# Patient Record
Sex: Female | Born: 1970 | Race: Black or African American | Hispanic: No | Marital: Single | Smoking: Never smoker
Health system: Southern US, Community
[De-identification: ages and names within clinical notes are randomized; demographics above are authoritative.]

## PROBLEM LIST (undated history)

## (undated) DIAGNOSIS — Z8049 Family history of malignant neoplasm of other genital organs: Secondary | ICD-10-CM

## (undated) DIAGNOSIS — T7840XA Allergy, unspecified, initial encounter: Secondary | ICD-10-CM

## (undated) DIAGNOSIS — Z800 Family history of malignant neoplasm of digestive organs: Secondary | ICD-10-CM

## (undated) DIAGNOSIS — J309 Allergic rhinitis, unspecified: Secondary | ICD-10-CM

## (undated) DIAGNOSIS — N946 Dysmenorrhea, unspecified: Secondary | ICD-10-CM

## (undated) DIAGNOSIS — Z8042 Family history of malignant neoplasm of prostate: Secondary | ICD-10-CM

## (undated) DIAGNOSIS — Z8041 Family history of malignant neoplasm of ovary: Secondary | ICD-10-CM

## (undated) HISTORY — DX: Family history of malignant neoplasm of digestive organs: Z80.0

## (undated) HISTORY — DX: Family history of malignant neoplasm of ovary: Z80.41

## (undated) HISTORY — DX: Family history of malignant neoplasm of other genital organs: Z80.49

## (undated) HISTORY — DX: Allergic rhinitis, unspecified: J30.9

## (undated) HISTORY — DX: Allergy, unspecified, initial encounter: T78.40XA

## (undated) HISTORY — DX: Family history of malignant neoplasm of prostate: Z80.42

## (undated) HISTORY — PX: WISDOM TOOTH EXTRACTION: SHX21

## (undated) HISTORY — DX: Dysmenorrhea, unspecified: N94.6

---

## 1999-04-28 ENCOUNTER — Other Ambulatory Visit: Admission: RE | Admit: 1999-04-28 | Discharge: 1999-04-28 | Payer: Self-pay | Admitting: Obstetrics and Gynecology

## 2000-04-29 ENCOUNTER — Other Ambulatory Visit: Admission: RE | Admit: 2000-04-29 | Discharge: 2000-04-29 | Payer: Self-pay | Admitting: Obstetrics and Gynecology

## 2002-01-17 ENCOUNTER — Other Ambulatory Visit: Admission: RE | Admit: 2002-01-17 | Discharge: 2002-01-17 | Payer: Self-pay | Admitting: Internal Medicine

## 2003-01-22 ENCOUNTER — Other Ambulatory Visit: Admission: RE | Admit: 2003-01-22 | Discharge: 2003-01-22 | Payer: Self-pay | Admitting: Obstetrics and Gynecology

## 2003-03-06 ENCOUNTER — Ambulatory Visit (HOSPITAL_COMMUNITY): Admission: RE | Admit: 2003-03-06 | Discharge: 2003-03-06 | Payer: Self-pay | Admitting: Internal Medicine

## 2003-08-27 ENCOUNTER — Other Ambulatory Visit: Admission: RE | Admit: 2003-08-27 | Discharge: 2003-08-27 | Payer: Self-pay | Admitting: Obstetrics and Gynecology

## 2004-01-27 ENCOUNTER — Other Ambulatory Visit: Admission: RE | Admit: 2004-01-27 | Discharge: 2004-01-27 | Payer: Self-pay | Admitting: Obstetrics and Gynecology

## 2005-01-25 ENCOUNTER — Ambulatory Visit (HOSPITAL_COMMUNITY): Admission: RE | Admit: 2005-01-25 | Discharge: 2005-01-25 | Payer: Self-pay | Admitting: Obstetrics and Gynecology

## 2010-09-01 ENCOUNTER — Other Ambulatory Visit (HOSPITAL_COMMUNITY): Payer: Self-pay | Admitting: Nurse Practitioner

## 2010-09-01 DIAGNOSIS — Z1231 Encounter for screening mammogram for malignant neoplasm of breast: Secondary | ICD-10-CM

## 2010-09-25 ENCOUNTER — Ambulatory Visit (HOSPITAL_COMMUNITY)
Admission: RE | Admit: 2010-09-25 | Discharge: 2010-09-25 | Disposition: A | Discharge status: Home / Self Care | Payer: BC Managed Care – PPO | Source: Ambulatory Visit | Attending: Nurse Practitioner | Admitting: Nurse Practitioner

## 2010-09-25 DIAGNOSIS — Z1231 Encounter for screening mammogram for malignant neoplasm of breast: Secondary | ICD-10-CM | POA: Insufficient documentation

## 2011-03-31 ENCOUNTER — Ambulatory Visit (INDEPENDENT_AMBULATORY_CARE_PROVIDER_SITE_OTHER): Payer: BC Managed Care – PPO | Admitting: Family Medicine

## 2011-03-31 VITALS — BP 95/64 | HR 93 | Temp 98.7°F | Resp 16 | Ht 65.0 in | Wt 165.0 lb

## 2011-03-31 DIAGNOSIS — D649 Anemia, unspecified: Secondary | ICD-10-CM

## 2011-03-31 DIAGNOSIS — J45909 Unspecified asthma, uncomplicated: Secondary | ICD-10-CM

## 2011-03-31 LAB — POCT CBC
Granulocyte percent: 80.9 %G — AB (ref 37–80)
HCT, POC: 36.2 % — AB (ref 37.7–47.9)
Hemoglobin: 11.2 g/dL — AB (ref 12.2–16.2)
Lymph, poc: 1.1 (ref 0.6–3.4)
MCH, POC: 26.4 pg — AB (ref 27–31.2)
MCHC: 30.9 g/dL — AB (ref 31.8–35.4)
MCV: 85.2 fL (ref 80–97)
MID (cbc): 0.3 (ref 0–0.9)
MPV: 8.7 fL (ref 0–99.8)
POC Granulocyte: 5.9 (ref 2–6.9)
POC LYMPH PERCENT: 14.7 %L (ref 10–50)
POC MID %: 4.4 %M (ref 0–12)
Platelet Count, POC: 214 10*3/uL (ref 142–424)
RBC: 4.25 M/uL (ref 4.04–5.48)
RDW, POC: 14.8 %
WBC: 7.3 10*3/uL (ref 4.6–10.2)

## 2011-03-31 MED ORDER — ALBUTEROL SULFATE (2.5 MG/3ML) 0.083% IN NEBU: 2.5000 mg | INHALATION_SOLUTION | Freq: Four times a day (QID) | RESPIRATORY_TRACT | Status: DC | PRN

## 2011-03-31 NOTE — Progress Notes (Signed)
  Subjective:    Patient ID: Deborah Hughes, female    DOB: Mar 03, 1970, 41 y.o.   MRN: [id number]  HPI Several week history of worsening nasal and facial congestion.  Face pain/pressure, pnd and occ cough(non productive, worse at night) Chills without fever Myalgias   Nonsmoker Review of Systems     Objective:   Physical Exam  Constitutional: She appears well-developed and well-nourished.  HENT:  Head: Normocephalic and atraumatic.  Neck: Neck supple.  Cardiovascular: Normal rate, regular rhythm and normal heart sounds.   Pulmonary/Chest: She has wheezes (coarse bs, prolonged expiratory phase).  Lymphadenopathy:    She has cervical adenopathy.  Neurological: She is alert.  Skin: Skin is warm.     Patient received 2.5 mg albuterol via HHN.  On repeat exam CTA with Inspiratory phase = Expiratory phase     Results for orders placed in visit on 03/31/11  POCT CBC      Component Value Range   WBC 7.3  4.6 - 10.2 (K/uL)   Lymph, poc 1.1  0.6 - 3.4    POC LYMPH PERCENT 14.7  10 - 50 (%L)   MID (cbc) 0.3  0 - 0.9    POC MID % 4.4  0 - 12 (%M)   POC Granulocyte 5.9  2 - 6.9    Granulocyte percent 80.9 (*) 37 - 80 (%G)   RBC 4.25  4.04 - 5.48 (M/uL)   Hemoglobin 11.2 (*) 12.2 - 16.2 (g/dL)   HCT, POC 44.0 (*) 10.2 - 47.9 (%)   MCV 85.2  80 - 97 (fL)   MCH, POC 26.4 (*) 27 - 31.2 (pg)   MCHC 30.9 (*) 31.8 - 35.4 (g/dL)   RDW, POC 72.5     Platelet Count, POC 214  142 - 424 (K/uL)   MPV 8.7  0 - 99.8 (fL)    Assessment & Plan:   1. Asthmatic bronchitis  POCT CBC, albuterol (PROVENTIL) (2.5 MG/3ML) 0.083% nebulizer solution, Peak flow, azithromycin (ZITHROMAX) 250 MG tablet, predniSONE (DELTASONE) 20 MG tablet, fluticasone (FLONASE) 50 MCG/ACT nasal spray  2. Anemia, mild      Anticipatory guidance. Call with follow up in 48 hours, sooner if worse.

## 2011-04-01 DIAGNOSIS — D649 Anemia, unspecified: Secondary | ICD-10-CM | POA: Insufficient documentation

## 2011-04-01 MED ORDER — PREDNISONE 20 MG PO TABS: ORAL_TABLET | ORAL | Status: AC

## 2011-04-01 MED ORDER — FLUTICASONE PROPIONATE 50 MCG/ACT NA SUSP: 2.0000 | Freq: Every day | NASAL | Status: DC

## 2011-04-01 MED ORDER — AZITHROMYCIN 250 MG PO TABS: ORAL_TABLET | ORAL | Status: AC

## 2011-09-20 ENCOUNTER — Other Ambulatory Visit (HOSPITAL_COMMUNITY): Payer: Self-pay | Admitting: Nurse Practitioner

## 2011-09-20 DIAGNOSIS — Z1231 Encounter for screening mammogram for malignant neoplasm of breast: Secondary | ICD-10-CM

## 2011-10-12 ENCOUNTER — Ambulatory Visit (HOSPITAL_COMMUNITY)
Admission: RE | Admit: 2011-10-12 | Discharge: 2011-10-12 | Disposition: A | Discharge status: Home / Self Care | Payer: BC Managed Care – PPO | Source: Ambulatory Visit | Attending: Nurse Practitioner | Admitting: Nurse Practitioner

## 2011-10-12 DIAGNOSIS — Z1231 Encounter for screening mammogram for malignant neoplasm of breast: Secondary | ICD-10-CM

## 2012-05-12 ENCOUNTER — Other Ambulatory Visit: Payer: Self-pay | Admitting: Obstetrics and Gynecology

## 2012-05-12 ENCOUNTER — Other Ambulatory Visit: Payer: Self-pay | Admitting: Internal Medicine

## 2012-05-12 DIAGNOSIS — N6320 Unspecified lump in the left breast, unspecified quadrant: Secondary | ICD-10-CM

## 2012-05-18 ENCOUNTER — Ambulatory Visit
Admission: RE | Admit: 2012-05-18 | Discharge: 2012-05-18 | Disposition: A | Discharge status: Home / Self Care | Payer: BC Managed Care – PPO | Source: Ambulatory Visit | Attending: Obstetrics and Gynecology | Admitting: Obstetrics and Gynecology

## 2012-05-18 DIAGNOSIS — N6320 Unspecified lump in the left breast, unspecified quadrant: Secondary | ICD-10-CM

## 2012-05-24 ENCOUNTER — Other Ambulatory Visit: Payer: BC Managed Care – PPO

## 2012-09-25 ENCOUNTER — Other Ambulatory Visit: Payer: Self-pay

## 2012-09-25 DIAGNOSIS — Z1231 Encounter for screening mammogram for malignant neoplasm of breast: Secondary | ICD-10-CM

## 2012-10-27 ENCOUNTER — Ambulatory Visit
Admission: RE | Admit: 2012-10-27 | Discharge: 2012-10-27 | Disposition: A | Discharge status: Home / Self Care | Payer: BC Managed Care – PPO | Source: Ambulatory Visit

## 2012-10-27 DIAGNOSIS — Z1231 Encounter for screening mammogram for malignant neoplasm of breast: Secondary | ICD-10-CM

## 2015-10-21 ENCOUNTER — Other Ambulatory Visit: Payer: Self-pay | Admitting: Obstetrics and Gynecology

## 2015-10-21 DIAGNOSIS — Z1231 Encounter for screening mammogram for malignant neoplasm of breast: Secondary | ICD-10-CM

## 2015-12-23 ENCOUNTER — Other Ambulatory Visit: Payer: Self-pay | Admitting: Obstetrics and Gynecology

## 2015-12-23 ENCOUNTER — Ambulatory Visit
Admission: RE | Admit: 2015-12-23 | Discharge: 2015-12-23 | Disposition: A | Discharge status: Home / Self Care | Payer: BC Managed Care – PPO | Source: Ambulatory Visit | Attending: Obstetrics and Gynecology | Admitting: Obstetrics and Gynecology

## 2015-12-23 ENCOUNTER — Other Ambulatory Visit (HOSPITAL_COMMUNITY)
Admission: RE | Admit: 2015-12-23 | Discharge: 2015-12-23 | Disposition: A | Discharge status: Home / Self Care | Payer: BC Managed Care – PPO | Source: Ambulatory Visit | Attending: Obstetrics and Gynecology | Admitting: Obstetrics and Gynecology

## 2015-12-23 DIAGNOSIS — Z01419 Encounter for gynecological examination (general) (routine) without abnormal findings: Secondary | ICD-10-CM | POA: Insufficient documentation

## 2015-12-23 DIAGNOSIS — Z1151 Encounter for screening for human papillomavirus (HPV): Secondary | ICD-10-CM | POA: Diagnosis present

## 2015-12-23 DIAGNOSIS — Z1231 Encounter for screening mammogram for malignant neoplasm of breast: Secondary | ICD-10-CM

## 2015-12-24 LAB — CYTOLOGY - PAP
Diagnosis: NEGATIVE
HPV: NOT DETECTED

## 2016-01-22 ENCOUNTER — Encounter: Payer: Self-pay | Admitting: Skilled Nursing Facility1

## 2016-01-22 ENCOUNTER — Encounter: Payer: BC Managed Care – PPO | Attending: Obstetrics and Gynecology | Admitting: Skilled Nursing Facility1

## 2016-01-22 DIAGNOSIS — Z713 Dietary counseling and surveillance: Secondary | ICD-10-CM | POA: Diagnosis not present

## 2016-01-22 DIAGNOSIS — R7303 Prediabetes: Secondary | ICD-10-CM | POA: Diagnosis not present

## 2016-01-22 NOTE — Patient Instructions (Addendum)
-  Ask your doctor about menopause  -Keep up the great work of controlling your stress  - Try to always have the components of a meal: carbohydrate/energy source, vegetables, and protein source  -More vegetables than the other stuff  -For grocery shopping:   Make a list of each component of a meal

## 2016-01-22 NOTE — Progress Notes (Signed)
  Medical Nutrition Therapy:  Appt start time: 8:00 end time: 9:00   Assessment:  Primary concerns today: referred for prediabetes. Pt states she switched physicians which caused a need for new blood work which found she had prediabetes. Pt states this year she has started herbalife shakes but then she saw she had prediabetes so she stopped them. Pt states she has a habit of going through drive threws.  Pts A1C 5.8.   Preferred Learning Style:   Visual  Hands on  Learning Readiness:   Change in progress  MEDICATIONS: See list   DIETARY INTAKE:  Usual eating pattern includes 1-2 meals and 1-2 snacks per day.  Everyday foods include nonstated.  Avoided foods include none stated.    24-hr recall:  B ( AM): none----fast food Snk ( AM): greek yogurt with granola---fruit L ( PM): pinto beans with onions and sausage, brown rice Snk ( PM): almonds---popcorn D ( PM): none----jacks corner(chicken kabob platter, pita bread, greek salad  Snk ( PM):  Beverages: sweet tea, water   Usual physical activity: hot yoga 60 minutes 2-3 days  Estimated energy needs: 1600 calories 180 g carbohydrates 120 g protein 44 g fat  Progress Towards Goal(s):  In progress.   Nutritional Diagnosis:  NB-1.1 Food and nutrition-related knowledge deficit As related to no prior nutrition education from a nutrition professional.  As evidenced by pt report and 24 hr recall.    Intervention:  Nutrition counseling for prediabetes. Dietitian educated the pt on prediabetes, meal frequency,a nd the importance of physical activity. Goals: -Ask your doctor about menopause  -Keep up the great work of controlling your stress  - Try to always have the components of a meal: carbohydrate/energy source, vegetables, and protein source  -More vegetables than the other stuff  -For grocery shopping:   Make a list of each component of a meal   Teaching Method Utilized:  Visual Auditory Hands on   Barriers to  learning/adherence to lifestyle change: none identified   Demonstrated degree of understanding via:  Teach Back   Monitoring/Evaluation:  Dietary intake, exercise, A1C, and body weight prn.

## 2016-04-02 ENCOUNTER — Encounter: Payer: Self-pay | Admitting: *Deleted

## 2016-04-13 ENCOUNTER — Encounter: Payer: Self-pay | Admitting: Cardiology

## 2016-04-13 ENCOUNTER — Ambulatory Visit (INDEPENDENT_AMBULATORY_CARE_PROVIDER_SITE_OTHER): Payer: BC Managed Care – PPO | Admitting: Cardiology

## 2016-04-13 VITALS — BP 112/64 | HR 66 | Ht 64.0 in | Wt 153.0 lb

## 2016-04-13 DIAGNOSIS — R079 Chest pain, unspecified: Secondary | ICD-10-CM

## 2016-04-13 DIAGNOSIS — R0602 Shortness of breath: Secondary | ICD-10-CM | POA: Diagnosis not present

## 2016-04-13 NOTE — Progress Notes (Signed)
Cardiology Office Note    Date:  04/13/2016   ID:  Deborah Hughes, DOB 08/21/1970, MRN [id number]  PCP:  No PCP Per Patient  Cardiologist:  Armanda Magicraci Maimuna Leaman, MD   Chief Complaint  Patient presents with  . Chest Pain  . Shortness of Breath    History of Present Illness:  Deborah Hughes is a 46 y.o. female with no prior cardiac history who presents today for evaluation of SOB and chest pain.  She says that this has been going on for a year.  She notices it when she is active doing yoga, running and walking.  She will get severe pressure on chest when she tries to run.  She has not been particularly active in the past so she initially though it was due to being out of shape. Recently, while sitting at her desk at work, she developed pressure in her chest with radiation down her left arm off and on for a day.  She denies any nausea or diaphoresis with the pain.  She has noticed DOE when she is physically active.  She denies any PND, orthopnea, LE edema, dizziness, palpitations or syncope.  She is concerned since she has a family history of CAD in her aunt.  She has never smoked.      Past Medical History:  Diagnosis Date  . Dysmenorrhea     History reviewed. No pertinent surgical history.  Current Medications: Current Meds  Medication Sig  . ibuprofen (ADVIL,MOTRIN) 800 MG tablet Take 800 mg by mouth every 8 (eight) hours as needed.  . magnesium gluconate (MAGONATE) 500 MG tablet Take 500 mg by mouth 3 (three) times daily.    Allergies:   Shellfish allergy   Social History   Social History  . Marital status: Married    Spouse name: N/A  . Number of children: N/A  . Years of education: N/A   Social History Main Topics  . Smoking status: Never Smoker  . Smokeless tobacco: Never Used  . Alcohol use Yes  . Drug use: No  . Sexual activity: Not Asked   Other Topics Concern  . None   Social History Narrative  . None     Family History:  The patient's family history  includes Colon cancer in her paternal grandfather; Diabetes in her father and other; Glaucoma in her mother; High Cholesterol in her father and mother; Ovarian cancer in her paternal grandmother; Stroke in her father.   ROS:   Please see the history of present illness.    ROS All other systems reviewed and are negative.  No flowsheet data found.     PHYSICAL EXAM:   VS:  BP 112/64   Pulse 66   Ht 5\' 4"  (1.626 m)   Wt 153 lb (69.4 kg)   BMI 26.26 kg/m    GEN: Well nourished, well developed, in no acute distress  HEENT: normal  Neck: no JVD, carotid bruits, or masses Cardiac: RRR; no murmurs, rubs, or gallops,no edema.  Intact distal pulses bilaterally.  Respiratory:  clear to auscultation bilaterally, normal work of breathing GI: soft, nontender, nondistended, + BS MS: no deformity or atrophy  Skin: warm and dry, no rash Neuro:  Alert and Oriented x 3, Strength and sensation are intact Psych: euthymic mood, full affect  Wt Readings from Last 3 Encounters:  04/13/16 153 lb (69.4 kg)  01/22/16 151 lb (68.5 kg)  03/31/11 165 lb (74.8 kg)      Studies/Labs Reviewed:   EKG:  EKG is ordered today.  The ekg ordered today demonstrates NSR with no ST changes  Recent Labs: No results found for requested labs within last 8760 hours.   Lipid Panel No results found for: CHOL, TRIG, HDL, CHOLHDL, VLDL, LDLCALC, LDLDIRECT  Additional studies/ records that were reviewed today include:  Office visit notes from PCP    ASSESSMENT:    1. Chest pain, unspecified type   2. SOB (shortness of breath)      PLAN:  In order of problems listed above:  1. Chest pain with typical and atypical features.  It occurs with exertion and can radiated down her arm.  It resolves with rest but has occurred while at rest.  She describes it as a pressure and gets very SOB.  She has no CRFs except for her family history in an aunt.  Her EKG is nonischemic.  ? Whether this could be due to adult onset  asthma.  She has had some problems with allergies as well.  I will set her up for an ETT to rule out ischemia.  2. SOB - this is only with exertion and she says that she has to stop her exercise because it leads to pressure on her chest.  I will get a 2D echo to assess LVF and diastolic function.  If echo and ETT are normal then would recommend PFTs to assess for adult onset asthma.     Medication Adjustments/Labs and Tests Ordered: Current medicines are reviewed at length with the patient today.  Concerns regarding medicines are outlined above.  Medication changes, Labs and Tests ordered today are listed in the Patient Instructions below.  There are no Patient Instructions on file for this visit.   Signed, Armanda Magic, MD  04/13/2016 9:15 AM    [facility] [address] [place], [place], [place]  [address] Phone: [phone]; Fax: [phone]

## 2016-04-13 NOTE — Patient Instructions (Signed)
Medication Instructions:  Your physician recommends that you continue on your current medications as directed. Please refer to the Current Medication list given to you today.   Labwork: None  Testing/Procedures: Your physician has requested that you have an echocardiogram. Echocardiography is a painless test that uses sound waves to create images of your heart. It provides your doctor with information about the size and shape of your heart and how well your heart's chambers and valves are working. This procedure takes approximately one hour. There are no restrictions for this procedure.   Your physician has requested that you have an exercise tolerance test. For further information please visit [email or web address]. Please also follow instruction sheet, as given.  Follow-Up: Your physician recommends that you schedule a follow-up appointment AS NEEDED with Dr. Turner pending study results.  Any Other Special Instructions Will Be Listed Below (If Applicable).     If you need a refill on your cardiac medications before your next appointment, please call your pharmacy.   

## 2016-05-06 ENCOUNTER — Ambulatory Visit (INDEPENDENT_AMBULATORY_CARE_PROVIDER_SITE_OTHER): Payer: BC Managed Care – PPO

## 2016-05-06 ENCOUNTER — Ambulatory Visit (HOSPITAL_COMMUNITY): Payer: BC Managed Care – PPO | Attending: Cardiovascular Disease

## 2016-05-06 ENCOUNTER — Other Ambulatory Visit: Payer: Self-pay

## 2016-05-06 DIAGNOSIS — R079 Chest pain, unspecified: Secondary | ICD-10-CM | POA: Diagnosis present

## 2016-05-06 DIAGNOSIS — I071 Rheumatic tricuspid insufficiency: Secondary | ICD-10-CM | POA: Insufficient documentation

## 2016-05-06 DIAGNOSIS — R0600 Dyspnea, unspecified: Secondary | ICD-10-CM | POA: Insufficient documentation

## 2016-05-06 LAB — EXERCISE TOLERANCE TEST
CSEPEW: 11.7 METS
Exercise duration (min): 10 min
Exercise duration (sec): 0 s
MPHR: 174 {beats}/min
Peak HR: 171 {beats}/min
Percent HR: 98 %
RPE: 17
Rest HR: 66 {beats}/min

## 2016-05-07 ENCOUNTER — Telehealth: Payer: Self-pay | Admitting: Cardiology

## 2016-05-07 DIAGNOSIS — R0602 Shortness of breath: Secondary | ICD-10-CM

## 2016-05-07 NOTE — Telephone Encounter (Signed)
Per DPR form, left detailed message with results on VM.  Instructed patient to call back to discuss further and to discuss PFTs.

## 2016-05-07 NOTE — Telephone Encounter (Signed)
Follow Up  ° °Pt is calling regarding test results. Please call. °

## 2016-05-07 NOTE — Telephone Encounter (Signed)
Follow Up:     Returning your call, concerning her test results. 

## 2016-05-07 NOTE — Telephone Encounter (Signed)
-----   Message from Quintella Reichertraci R Turner, MD sent at 05/07/2016  9:37 AM EDT ----- Please get PFTs with [facility]

## 2016-05-07 NOTE — Telephone Encounter (Signed)
Informed patient of stress test and ECHO results and verbal understanding expressed.   PFTs ordered for scheduling. Patient agrees with treatment plan.

## 2016-07-08 ENCOUNTER — Ambulatory Visit (INDEPENDENT_AMBULATORY_CARE_PROVIDER_SITE_OTHER): Payer: BC Managed Care – PPO | Admitting: Internal Medicine

## 2016-07-08 DIAGNOSIS — R0602 Shortness of breath: Secondary | ICD-10-CM

## 2016-07-08 LAB — PULMONARY FUNCTION TEST
DL/VA % pred: 94 %
DL/VA: 4.69 ml/min/mmHg/L
DLCO cor % pred: 64 %
DLCO cor: 16.95 ml/min/mmHg
DLCO unc % pred: 66 %
DLCO unc: 17.35 ml/min/mmHg
FEF 25-75 Post: 4.56 L/sec
FEF 25-75 Pre: 3.17 L/sec
FEF2575-%Change-Post: 43 %
FEF2575-%Pred-Post: 168 %
FEF2575-%Pred-Pre: 117 %
FEV1-%Change-Post: 0 %
FEV1-%Pred-Post: 97 %
FEV1-%Pred-Pre: 97 %
FEV1-Post: 2.47 L
FEV1-Pre: 2.48 L
FEV1FVC-%Change-Post: 1 %
FEV1FVC-%Pred-Pre: 111 %
FEV6-%Change-Post: -2 %
FEV6-%PRED-POST: 86 %
FEV6-%Pred-Pre: 88 %
FEV6-PRE: 2.71 L
FEV6-Post: 2.65 L
FEV6FVC-%Pred-Post: 102 %
FEV6FVC-%Pred-Pre: 102 %
FVC-%Change-Post: -1 %
FVC-%Pred-Post: 85 %
FVC-%Pred-Pre: 86 %
FVC-Post: 2.67 L
FVC-Pre: 2.71 L
PRE FEV6/FVC RATIO: 100 %
Post FEV1/FVC ratio: 93 %
Post FEV6/FVC ratio: 100 %
Pre FEV1/FVC ratio: 91 %
RV % pred: 101 %
RV: 1.81 L
TLC % pred: 85 %
TLC: 4.53 L

## 2016-07-08 NOTE — Progress Notes (Signed)
PFT done today. 

## 2016-07-13 ENCOUNTER — Encounter: Payer: Self-pay | Admitting: Pulmonary Disease

## 2016-07-13 ENCOUNTER — Telehealth: Payer: Self-pay

## 2016-07-13 DIAGNOSIS — R942 Abnormal results of pulmonary function studies: Secondary | ICD-10-CM

## 2016-07-13 NOTE — Telephone Encounter (Signed)
-----   Message from Quintella Reichertraci R Turner, MD sent at 07/11/2016  7:14 PM EDT ----- Patient has significant DOE and abnormal PFTs - please refer to pulmonary for evaluation

## 2016-07-13 NOTE — Telephone Encounter (Signed)
Informed patient of results and verbal understanding expressed.   Pulmonary referral placed for scheduling. Patient agrees with treatment plan. 

## 2016-08-04 ENCOUNTER — Ambulatory Visit (INDEPENDENT_AMBULATORY_CARE_PROVIDER_SITE_OTHER): Payer: BC Managed Care – PPO | Admitting: Pulmonary Disease

## 2016-08-04 ENCOUNTER — Encounter: Payer: Self-pay | Admitting: Pulmonary Disease

## 2016-08-04 ENCOUNTER — Ambulatory Visit (INDEPENDENT_AMBULATORY_CARE_PROVIDER_SITE_OTHER)
Admission: RE | Admit: 2016-08-04 | Discharge: 2016-08-04 | Disposition: A | Discharge status: Home / Self Care | Payer: BC Managed Care – PPO | Source: Ambulatory Visit | Attending: Pulmonary Disease | Admitting: Pulmonary Disease

## 2016-08-04 DIAGNOSIS — J302 Other seasonal allergic rhinitis: Secondary | ICD-10-CM | POA: Diagnosis not present

## 2016-08-04 DIAGNOSIS — R0600 Dyspnea, unspecified: Secondary | ICD-10-CM

## 2016-08-04 DIAGNOSIS — R0789 Other chest pain: Secondary | ICD-10-CM | POA: Diagnosis not present

## 2016-08-04 NOTE — Progress Notes (Signed)
Subjective:    Patient ID: Deborah Hughes, female    DOB: 10/22/1970, 46 y.o.   MRN: [id number]  HPI She reports starting a year ago she began to notice dyspnea on exertion during exercise. She reports she had increased her distance with walking but felt like her endurance was not improving. She reports recently at work she had an episode of "chest pressure" and "tightness" at her desk while working. She reports that this chest discomfort is infrequent and resolves spontaneously after a short period of approximately 1 minute. She reports she has also had dyspnea as well as this chest discomfort while doing hot yoga. She reports downward dog and transitioning to plan. The chest discomfort began at the same time as her dyspnea. She denies any associated palpitations. She denies any wheezing. No associated coughing. She reports in the recent couple of years she has developed sinus congestion & drainage that is seasonal. She does use Claritin seasonally. No breathing problems or asthma as a child. Remote history of bronchitis but nothing recurrent. No history of pneumonia. No fever, chills, or sweats. No reflux, dyspepsia, or morning brash water taste. She has been told that she snores while sleeping. No morning headache. She does nap when able to and dozes off easily.   Review of Systems No rashes or abnormal bruising. No abdominal pain, nausea, emesis, or diarrhea. No joint swelling or erythema. She reports some joint discomfort & soreness that she has attributed to yoga. A pertinent 14 point review of systems is negative except as per the history of presenting illness.  Allergies  Allergen Reactions  . Shellfish Allergy     Current Outpatient Prescriptions on File Prior to Visit  Medication Sig Dispense Refill  . ibuprofen (ADVIL,MOTRIN) 800 MG tablet Take 800 mg by mouth every 8 (eight) hours as needed.    . magnesium gluconate (MAGONATE) 500 MG tablet Take 500 mg by mouth 3 (three) times daily.      No current facility-administered medications on file prior to visit.     Past Medical History:  Diagnosis Date  . Allergic rhinitis   . Dysmenorrhea     Past Surgical History:  Procedure Laterality Date  . WISDOM TOOTH EXTRACTION      Family History  Problem Relation Age of Onset  . Diabetes Other   . Rheum arthritis Other   . High Cholesterol Mother   . Glaucoma Mother   . Rheum arthritis Mother   . Stroke Father   . High Cholesterol Father   . Diabetes Father   . Ovarian cancer Paternal Grandmother   . Colon cancer Paternal Grandfather   . Uterine cancer Maternal Grandmother   . Prostate cancer Maternal Grandfather   . Lung disease Neg Hx     Social History   Social History  . Marital status: Married    Spouse name: N/A  . Number of children: N/A  . Years of education: N/A   Social History Main Topics  . Smoking status: Passive Smoke Exposure - Never Smoker  . Smokeless tobacco: Never Used     Comment: Significant other.   . Alcohol use Yes     Comment: Social.   . Drug use: No  . Sexual activity: Not Asked   Other Topics Concern  . None   Social History Narrative   [facility] (08/04/16):   [place] always lived [place]. 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 Has traveled to [place], [place], & [place]. Does have a fish. No bird, mold, or hot tub exposure.       Objective:   Physical Exam BP 114/72 (BP Location: Left Arm, Patient Position: Sitting, Cuff Size: Normal)   Pulse 77   Ht 5\' 4"  (1.626 m)   Wt 156 lb (70.8 kg)   SpO2 99%   BMI 26.78 kg/m  General:  Awake. Alert. No acute distress.  Integument:  Warm & dry. No rash on exposed skin. No bruising. Extremities:  No cyanosis or clubbing.  Lymphatics:  No appreciated cervical or supraclavicular lymphadenoapthy. HEENT:  Moist mucus membranes. No oral ulcers. No scleral injection or icterus. Minimal bilateral nasal  turbinate swelling. Cardiovascular:  Regular rate and rhythm. No edema. No appreciable JVD.  Pulmonary:  Good aeration & clear to auscultation bilaterally. Symmetric chest wall expansion. No accessory muscle use on room air. Abdomen: Soft. Normal bowel sounds. Nondistended. Grossly nontender. No mass appreciated. No hepatosplenomegaly. Musculoskeletal:  Normal bulk and tone. Hand grip strength 5/5 bilaterally. No joint deformity or effusion appreciated. No synovial thickening of bilateral MCP, PIP, and DIP joints. Neurological:  CN 2-12 grossly in tact. No meningismus. Moving all 4 extremities equally. Symmetric brachioradialis deep tendon reflexes. Psychiatric:  Mood and affect congruent. Speech normal rhythm, rate & tone.   PFT 07/08/16: FVC 2.71 L (86%) FEV1 2.48 L (97%) FEV1/FVC 0.91 FEF 25-75 3.17 L (160%) negative bronchodilator response TLC 4.53 L (85%) RV 101% ERV 104% DLCO corrected 64%  CARDIAC TTE (05/06/16): LV normal in size with EF 55-60% & no regional wall motion abnormalities. Normal diastolic function. LA & RA normal in size. RV normal in size and function. No aortic stenosis or regurgitation. Aortic root normal in size. No mitral stenosis or regurgitation. No pulmonic stenosis. Mild tricuspid regurgitation. No pericardial effusion.    Assessment & Plan:  46 y.o. female with approximately one year of atypical chest pain and dyspnea that seems to be episodic. Interestingly the patient has developed chronic seasonal allergic rhinitis which would raise the possibility of exercise-induced or allergy-induced asthma. However, her spirometry was normal from previous pulmonary function testing and she had no significant bronchodilator response. Despite normal lung volumes her carbon monoxide diffusion capacity is mildly decreased. This would raise the possibility of an underlying early parenchymal lung disease or possibly a pulmonary vascular disease. However, the patient has normal right  ventricular function. With her family history of rheumatoid arthritis and autoimmune process must be considered, but the patient has no symptoms at this time. I am going to further investigate with some basic chest imaging and a 6 minute walk test before proceeding to a more in depth testing.  1. Dyspnea: Checking 6 minute walk test on room air before next appointment. Consider methacholine challenge testing versus cardiopulmonary exercise testing depending upon this result. 2. Atypical chest pain: Holding off on autoimmune and inflammatory workup. Checking chest x-ray PA/LAT today. Considering high-resolution CT chest versus VQ scan depending upon results. 3. Chronic seasonal allergic rhinitis: Continuing Claritin for seasonal relief. Holding on serum allergy testing. 4. Follow-up: Patient to return to clinic in 4 weeks or sooner if needed.  Donna Christen Jamison Neighbor, M.D. [facility] Pager:  [phone] After 3pm or if no response, call [phone] 2:05 PM 08/04/16

## 2016-08-04 NOTE — Patient Instructions (Signed)
   Call or e-mail me if you have any new breathing problems or questions before your next appointment.  I will see you back in a couple of weeks to go over your results but depending on your x-ray we may need to do further testing.  TESTS ORDERED: 1. CXR PA/LAT TODAY 2. ON ROOM AIR BEFORE NEXT APPOINTMENT

## 2016-08-05 ENCOUNTER — Telehealth: Payer: Self-pay | Admitting: Pulmonary Disease

## 2016-08-05 ENCOUNTER — Other Ambulatory Visit: Payer: Self-pay

## 2016-08-05 DIAGNOSIS — R0602 Shortness of breath: Secondary | ICD-10-CM

## 2016-08-05 NOTE — Progress Notes (Signed)
ATC pt, left message to contact office.

## 2016-08-05 NOTE — Telephone Encounter (Signed)
Pt is aware of results and voiced her understanding. VQ has been ordered. Nothing further needed.    Deborah Hughes, Jennings E, MD  Pamalee LeydenWiggins, Deborah J, RN        Please let the patient know that I reviewed her chest x-ray. There is no abnormality that I can discern. As such, please order a VQ scan. Thank you.

## 2016-08-11 ENCOUNTER — Ambulatory Visit (HOSPITAL_COMMUNITY): Payer: BC Managed Care – PPO

## 2016-08-13 ENCOUNTER — Encounter (HOSPITAL_COMMUNITY)
Admission: RE | Admit: 2016-08-13 | Discharge: 2016-08-13 | Disposition: A | Discharge status: Home / Self Care | Payer: BC Managed Care – PPO | Source: Ambulatory Visit | Attending: Pulmonary Disease | Admitting: Pulmonary Disease

## 2016-08-13 ENCOUNTER — Ambulatory Visit (HOSPITAL_COMMUNITY)
Admission: RE | Admit: 2016-08-13 | Discharge: 2016-08-13 | Disposition: A | Discharge status: Home / Self Care | Payer: BC Managed Care – PPO | Source: Ambulatory Visit | Attending: Pulmonary Disease | Admitting: Pulmonary Disease

## 2016-08-13 DIAGNOSIS — R0602 Shortness of breath: Secondary | ICD-10-CM | POA: Insufficient documentation

## 2016-08-13 MED ORDER — TECHNETIUM TO 99M ALBUMIN AGGREGATED
3.8500 | Freq: Once | INTRAVENOUS | Status: AC | PRN
  Administered 2016-08-13: 3.85 via INTRAVENOUS

## 2016-08-13 MED ORDER — TECHNETIUM TC 99M DIETHYLENETRIAME-PENTAACETIC ACID
32.0000 | Freq: Once | INTRAVENOUS | Status: AC | PRN
  Administered 2016-08-13: 32 via RESPIRATORY_TRACT

## 2016-08-13 NOTE — Progress Notes (Signed)
Spoke with patient and informed her of results. Pt verbalized understanding and did not have any questions. Nothing further is needed.

## 2016-09-08 ENCOUNTER — Ambulatory Visit (INDEPENDENT_AMBULATORY_CARE_PROVIDER_SITE_OTHER): Payer: BC Managed Care – PPO | Admitting: *Deleted

## 2016-09-08 ENCOUNTER — Encounter: Payer: Self-pay | Admitting: Pulmonary Disease

## 2016-09-08 ENCOUNTER — Ambulatory Visit (INDEPENDENT_AMBULATORY_CARE_PROVIDER_SITE_OTHER): Payer: BC Managed Care – PPO | Admitting: Pulmonary Disease

## 2016-09-08 VITALS — BP 120/66 | HR 75 | Ht 64.0 in | Wt 158.6 lb

## 2016-09-08 DIAGNOSIS — R0600 Dyspnea, unspecified: Secondary | ICD-10-CM

## 2016-09-08 DIAGNOSIS — J302 Other seasonal allergic rhinitis: Secondary | ICD-10-CM | POA: Diagnosis not present

## 2016-09-08 NOTE — Patient Instructions (Signed)
   Call or e-mail me if your symptoms worsen or you notice anything new.  I will leave your next appointment open-ended so that it can be scheduled if you call back and we need to order the Cardio-pulmonary exercise test that we discussed today.

## 2016-09-08 NOTE — Progress Notes (Signed)
Subjective:    Patient ID: Deborah Hughes, female    DOB: 1970/07/28, 46 y.o.   MRN: [id number]  [facility]  Follow-up for Dyspnea, Atypical Chest Pain, & Chronic Seasonal Allergic Rhinitis.   HPI Dyspnea: Normal VQ scan despite reduced carbon monoxide diffusion capacity. She still has no coughing or wheezing. She still has dyspnea on exertion with walking & going up stairs.   Atypical Chest Pain:  She is still having "pressure" in her chest. No other new chest pain or tightness.   Chronic Seasonal Allergic Rhinitis: Patient using Claritin previously. She reports minimal sinus congestion & drainage. She has been using Claritin intermittently.   Review of Systems No fever or chills. No rashes or bruising. No abdominal pain or nausea. She does report significant fatigue.   Allergies  Allergen Reactions  . Shellfish Allergy     Current Outpatient Prescriptions on File Prior to Visit  Medication Sig Dispense Refill  . ibuprofen (ADVIL,MOTRIN) 800 MG tablet Take 800 mg by mouth every 8 (eight) hours as needed.    . magnesium gluconate (MAGONATE) 500 MG tablet Take 500 mg by mouth 3 (three) times daily.    . Red Yeast Rice Extract (RED YEAST RICE PO) Take 1 tablet by mouth daily.     No current facility-administered medications on file prior to visit.     Past Medical History:  Diagnosis Date  . Allergic rhinitis   . Dysmenorrhea     Past Surgical History:  Procedure Laterality Date  . WISDOM TOOTH EXTRACTION      Family History  Problem Relation Age of Onset  . Diabetes Other   . Rheum arthritis Other   . High Cholesterol Mother   . Glaucoma Mother   . Rheum arthritis Mother   . Stroke Father   . High Cholesterol Father   . Diabetes Father   . Ovarian cancer Paternal Grandmother   . Colon cancer Paternal Grandfather   . Uterine cancer Maternal Grandmother   . Prostate cancer Maternal Grandfather   . Lung disease Neg Hx     Social History   Social History  . Marital  status: Divorced    Spouse name: N/A  . Number of children: N/A  . Years of education: N/A   Social History Main Topics  . Smoking status: Passive Smoke Exposure - Never Smoker  . Smokeless tobacco: Never Used     Comment: Significant other.   . Alcohol use Yes     Comment: Social.   . Drug use: No  . Sexual activity: Not Asked   Other Topics Concern  . None   Social History Narrative   [facility] (08/04/16):   Originally from [facility]. Has always lived in [place]. 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 [place] to [place] & [place]. Does have a fish. No bird, mold, or hot tub exposure.       Objective:   Physical Exam BP 120/66 (BP Location: Right Arm, Cuff Size: Normal)   Pulse 75   Ht 5\' 4"  (1.626 m)   Wt 158 lb 9.6 oz (71.9 kg)   SpO2 99%   BMI 27.22 kg/m   General:  Awake. Alert. Young female. Integument:  Warm & dry. No rash on exposed skin. No bruising. On exposed skin Extremities:  No cyanosis or clubbing.  HEENT:  Moist mucus membranes. Minimal nasal turbinate swelling. No oral ulcers. Cardiovascular:  Regular rate. No edema. Normal  S1 & S2. Pulmonary:  Clear to auscultation. Normal work of breathing on room air. Abdomen: Soft. Normal bowel sounds. Nondistended. Musculoskeletal:  Normal bulk and tone. No joint deformity or effusion appreciated.  PFT 07/08/16: FVC 2.71 L (86%) FEV1 2.48 L (97%) FEV1/FVC 0.91 FEF 25-75 3.17 L (160%) negative bronchodilator response TLC 4.53 L (85%) RV 101% ERV 104% DLCO corrected 64%  6MWT 09/08/16:  Walked 402 meters / Baseline Sat 100% on RA / Nadir Sat 100% on RA  IMAGING CXR PA/LAT 08/13/16 (personally reviewed by me):  No pleural effusion. No parenchymal mass or opacity. Heart normal in size & mediastinum normal in contour.  V/Q SCAN 08/13/16 (per radiologist):  Normal ventilation and perfusion lung scan.  CARDIAC TTE (05/06/16): LV normal in size with EF  55-60% & no regional wall motion abnormalities. Normal diastolic function. LA & RA normal in size. RV normal in size and function. No aortic stenosis or regurgitation. Aortic root normal in size. No mitral stenosis or regurgitation. No pulmonic stenosis. Mild tricuspid regurgitation. No pericardial effusion.    Assessment & Plan:  46 y.o. female with one year of episodic dyspnea and atypical chest pain. Also notably has chronic seasonal allergic rhinitis.Patient's walk test today demonstrates no oxygen requirement. In the absence of any parenchymal abnormalities on her chest x-ray and with a normal VQ scan the mild reduction in her carbon monoxide diffusion capacity is of unclear significance and likely secondary to test variability. We did discuss methacholine challenge testing versus empiric treatment with inhaler medications versus cardiopulmonary exercise testing to evaluate for possible underlying asthma/exercise-induced asthma. The patient wishes to defer further testing at this time and monitor symptoms. She reports that she will contact me if her symptoms worsen or fail to improve at which time we will proceed with cardiopulmonary exercise testing.  1. Dyspnea on exertion: Suspicious for exercise-induced asthma. Deferring cardiopulmonary exercise testing and methacholine challenge testing for now. 2. Atypical chest pain: Likely pulmonary in etiology. Deferring further workup for now. 3. Chronic seasonal allergic rhinitis: Controlled with Claritin intermittently. No new medications. 4. Follow-up: Return to clinic as needed.  Donna ChristenJennings E. Jamison NeighborNestor, M.D. [facility] Pager:  [phone] After 3pm or if no response, call [phone] 3:32 PM 09/08/16

## 2016-09-08 NOTE — Progress Notes (Signed)
SIX MIN WALK 09/08/2016  Medications None  Supplimental Oxygen during Test? (L/min) No  Laps 8  Partial Lap (in Meters) 18  Baseline BP (sitting) 114/68  Baseline Heartrate 79  Baseline Dyspnea (Borg Scale) 1  Baseline Fatigue (Borg Scale) 3  Baseline SPO2 100  BP (sitting) 124/72  Heartrate 97  Dyspnea (Borg Scale) 1  Fatigue (Borg Scale) 2  SPO2 100  BP (sitting) 122/76  Heartrate 82  SPO2 100  Stopped or Paused before Six Minutes No  Distance Completed 402  Tech Comments: Patient walked at a brisk pace. Denied any SOB did state that Deborah Hughes felt some pressure in her chest. Deborah Hughes described it as a "good pressure" since it felt like her blood was circulating better. Denied any other body aches or pains.

## 2016-09-18 ENCOUNTER — Ambulatory Visit (INDEPENDENT_AMBULATORY_CARE_PROVIDER_SITE_OTHER): Payer: BC Managed Care – PPO | Admitting: Physician Assistant

## 2016-09-18 ENCOUNTER — Encounter: Payer: Self-pay | Admitting: Physician Assistant

## 2016-09-18 VITALS — BP 96/61 | HR 68 | Temp 98.6°F | Resp 17 | Ht 64.0 in | Wt 158.2 lb

## 2016-09-18 DIAGNOSIS — R0981 Nasal congestion: Secondary | ICD-10-CM | POA: Diagnosis not present

## 2016-09-18 DIAGNOSIS — H6122 Impacted cerumen, left ear: Secondary | ICD-10-CM | POA: Diagnosis not present

## 2016-09-18 MED ORDER — FLUTICASONE PROPIONATE 50 MCG/ACT NA SUSP: 2.0000 | Freq: Every day | NASAL | 0 refills | Status: AC

## 2016-09-18 MED ORDER — PSEUDOEPHEDRINE HCL ER 120 MG PO TB12: 120.0000 mg | ORAL_TABLET | Freq: Two times a day (BID) | ORAL | 0 refills | Status: AC

## 2016-09-18 NOTE — Patient Instructions (Addendum)
For sinus and nasal congestion, I recommend taking 12 hour sudafed, flonase, and OTC claritin daily for the next 5-7 days. This should also help with ear fullness sensation. However, due to the cerumen impaction that we could not successfully get out, I am placing a referral to ENT. I have requested they get you in within the week. Please call our office if you have not heard from them within a week. In the future, try to avoid qtips and just use a wash rag to clean the outside of your ears. Thank you for letting me participate in your health and well being.  Eustachian Tube Dysfunction The eustachian tube connects the middle ear to the back of the nose. It regulates air pressure in the middle ear by allowing air to move between the ear and nose. It also helps to drain fluid from the middle ear space. When the eustachian tube does not function properly, air pressure, fluid, or both can build up in the middle ear. Eustachian tube dysfunction can affect one or both ears. What are the causes? This condition happens when the eustachian tube becomes blocked or cannot open normally. This may result from:  Ear infections.  Colds and other upper respiratory infections.  Allergies.  Irritation, such as from cigarette smoke or acid from the stomach coming up into the esophagus (gastroesophageal reflux).  Sudden changes in air pressure, such as from descending in an airplane.  Abnormal growths in the nose or throat, such as nasal polyps, tumors, or enlarged tissue at the back of the throat (adenoids).  What increases the risk? This condition may be more likely to develop in people who smoke and people who are overweight. Eustachian tube dysfunction may also be more likely to develop in children, especially children who have:  Certain birth defects of the mouth, such as cleft palate.  Large tonsils and adenoids.  What are the signs or symptoms? Symptoms of this condition may include:  A feeling of  fullness in the ear.  Ear pain.  Clicking or popping noises in the ear.  Ringing in the ear.  Hearing loss.  Loss of balance.  Symptoms may get worse when the air pressure around you changes, such as when you travel to an area of high elevation or fly on an airplane. How is this diagnosed? This condition may be diagnosed based on:  Your symptoms.  A physical exam of your ear, nose, and throat.  Tests, such as those that measure: ? The movement of your eardrum (tympanogram). ? Your hearing (audiometry).  How is this treated? Treatment depends on the cause and severity of your condition. If your symptoms are mild, you may be able to relieve your symptoms by moving air into ("popping") your ears. If you have symptoms of fluid in your ears, treatment may include:  Decongestants.  Antihistamines.  Nasal sprays or ear drops that contain medicines that reduce swelling (steroids).  In some cases, you may need to have a procedure to drain the fluid in your eardrum (myringotomy). In this procedure, a small tube is placed in the eardrum to:  Drain the fluid.  Restore the air in the middle ear space.  Follow these instructions at home:  Take over-the-counter and prescription medicines only as told by your health care provider.  Use techniques to help pop your ears as recommended by your health care provider. These may include: ? Chewing gum. ? Yawning. ? Frequent, forceful swallowing. ? Closing your mouth, holding your nose closed,  and gently blowing as if you are trying to blow air out of your nose.  Do not do any of the following until your health care provider approves: ? Travel to high altitudes. ? Fly in airplanes. ? Work in a Estate agent or room. ? Scuba dive.  Keep your ears dry. Dry your ears completely after showering or bathing.  Do not smoke.  Keep all follow-up visits as told by your health care provider. This is important. Contact a health care  provider if:  Your symptoms do not go away after treatment.  Your symptoms come back after treatment.  You are unable to pop your ears.  You have: ? A fever. ? Pain in your ear. ? Pain in your head or neck. ? Fluid draining from your ear.  Your hearing suddenly changes.  You become very dizzy.  You lose your balance. This information is not intended to replace advice given to you by your health care provider. Make sure you discuss any questions you have with your health care provider. Document Released: 02/28/2015 Document Revised: 07/10/2015 Document Reviewed: 02/20/2014 Elsevier Interactive Patient Education  2018 [employer].   Earwax Buildup, Adult The ears produce a substance called earwax that helps keep bacteria out of the ear and protects the skin in the ear canal. Occasionally, earwax can build up in the ear and cause discomfort or hearing loss. What increases the risk? This condition is more likely to develop in people who:  Are female.  Are elderly.  Naturally produce more earwax.  Clean their ears often with cotton swabs.  Use earplugs often.  Use in-ear headphones often.  Wear hearing aids.  Have narrow ear canals.  Have earwax that is overly thick or sticky.  Have eczema.  Are dehydrated.  Have excess hair in the ear canal.  What are the signs or symptoms? Symptoms of this condition include:  Reduced or muffled hearing.  A feeling of fullness in the ear or feeling that the ear is plugged.  Fluid coming from the ear.  Ear pain.  Ear itch.  Ringing in the ear.  Coughing.  An obvious piece of earwax that can be seen inside the ear canal.  How is this diagnosed? This condition may be diagnosed based on:  Your symptoms.  Your medical history.  An ear exam. During the exam, your health care provider will look into your ear with an instrument called an otoscope.  You may have tests, including a hearing test. How is this  treated? This condition may be treated by:  Using ear drops to soften the earwax.  Having the earwax removed by a health care provider. The health care provider may: ? Flush the ear with water. ? Use an instrument that has a loop on the end (curette). ? Use a suction device.  Surgery to remove the wax buildup. This may be done in severe cases.  Follow these instructions at home:  Take over-the-counter and prescription medicines only as told by your health care provider.  Do not put any objects, including cotton swabs, into your ear. You can clean the opening of your ear canal with a washcloth or facial tissue.  Follow instructions from your health care provider about cleaning your ears. Do not over-clean your ears.  Drink enough fluid to keep your urine clear or pale yellow. This will help to thin the earwax.  Keep all follow-up visits as told by your health care provider. If earwax builds up in your  ears often or if you use hearing aids, consider seeing your health care provider for routine, preventive ear cleanings. Ask your health care provider how often you should schedule your cleanings.  If you have hearing aids, clean them according to instructions from the manufacturer and your health care provider. Contact a health care provider if:  You have ear pain.  You develop a fever.  You have blood, pus, or other fluid coming from your ear.  You have hearing loss.  You have ringing in your ears that does not go away.  Your symptoms do not improve with treatment.  You feel like the room is spinning (vertigo). Summary  Earwax can build up in the ear and cause discomfort or hearing loss.  The most common symptoms of this condition include reduced or muffled hearing and a feeling of fullness in the ear or feeling that the ear is plugged.  This condition may be diagnosed based on your symptoms, your medical history, and an ear exam.  This condition may be treated by using  ear drops to soften the earwax or by having the earwax removed by a health care provider.  Do not put any objects, including cotton swabs, into your ear. You can clean the opening of your ear canal with a washcloth or facial tissue. This information is not intended to replace advice given to you by your health care provider. Make sure you discuss any questions you have with your health care provider. Document Released: 03/11/2004 Document Revised: 04/14/2016 Document Reviewed: 04/14/2016 Elsevier Interactive Patient Education  2018 [employer]  IF you received an x-ray today, you will receive an invoice from [facility]. Please contact [facility] at [phone] with questions or concerns regarding your invoice.   IF you received labwork today, you will receive an invoice from [facility]. Please contact [facility] at [phone] with questions or concerns regarding your invoice.   Our billing staff will not be able to assist you with questions regarding bills from these companies.  You will be contacted with the lab results as soon as they are available. The fastest way to get your results is to activate your My Chart account. Instructions are located on the last page of this paperwork. If you have not heard from us regarding the results in 2 weeks, please contact this office.

## 2016-09-18 NOTE — Progress Notes (Signed)
Deborah Settersatricia Collett  MRN: [id number] DOB: [birth year]  Subjective:  Deborah Hughes is a 46 y.o. female seen in office today for a chief complaint of left ear pain x 10 days. Has associated ear fullness. Now has sinus congestion and mild headache. Denies acute injury, tinnitus, hearing loss, dizziness, external ear pain, fever, chills, and diaphoresis. Has tried three tablets of her mom's amoxicillin and also essential ear oil. Has helped but not fully. Has used qtips occasionally. Has had no sick contacts. Has mild hx of seasonal allergies, takes Claritin occasionally.   Review of Systems  HENT: Negative for ear discharge, facial swelling and sore throat.   Eyes: Negative for pain and itching.  Respiratory: Negative for cough.   Cardiovascular: Negative for chest pain and palpitations.  Gastrointestinal: Negative for abdominal pain, nausea and vomiting.    Patient Active Problem List   Diagnosis Date Noted  . Dyspnea 08/04/2016  . Atypical chest pain 08/04/2016  . Chronic seasonal allergic rhinitis 08/04/2016  . Anemia, mild 04/01/2011    Current Outpatient Prescriptions on File Prior to Visit  Medication Sig Dispense Refill  . ibuprofen (ADVIL,MOTRIN) 800 MG tablet Take 800 mg by mouth every 8 (eight) hours as needed.    . magnesium gluconate (MAGONATE) 500 MG tablet Take 500 mg by mouth 3 (three) times daily.    . Red Yeast Rice Extract (RED YEAST RICE PO) Take 1 tablet by mouth daily.     No current facility-administered medications on file prior to visit.     Allergies  Allergen Reactions  . Shellfish Allergy       Social History   Social History  . Marital status: Divorced    Spouse name: N/A  . Number of children: N/A  . Years of education: N/A   Occupational History  . Not on file.   Social History Main Topics  . Smoking status: Passive Smoke Exposure - Never Smoker  . Smokeless tobacco: Never Used     Comment: Significant other.   . Alcohol use Yes   Comment: Social.   . Drug use: No  . Sexual activity: Not on file   Other Topics Concern  . Not on file   Social History Narrative   [facility] (08/04/16):   [place] from [place]. Has always lived in [place]. 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 Has [place] to [place], & [place]. Does have a fish. No bird, mold, or hot tub exposure.     Objective:  BP 96/61   Pulse 68   Temp 98.6 F (37 C) (Oral)   Resp 17   Ht 5\' 4"  (1.626 m)   Wt 158 lb 4 oz (71.8 kg)   LMP 09/16/2016 (Exact Date)   SpO2 99%   BMI 27.16 kg/m   Physical Exam  Constitutional: She is oriented to person, place, and time and well-developed, well-nourished, and in no distress.  HENT:  Head: Normocephalic and atraumatic.  Right Ear: Tympanic membrane, external ear and ear canal normal.  Left Ear: There is drainage (dark brown cerumen blocking view of TM) and tenderness (with palpation of tragus). No mastoid tenderness.  Nose: Mucosal edema (moderate bilaterally) present. Right sinus exhibits no maxillary sinus tenderness and no frontal sinus tenderness. Left sinus exhibits no maxillary sinus tenderness and no frontal sinus tenderness.  Mouth/Throat: Uvula is midline and mucous membranes are normal. Posterior oropharyngeal erythema present.  Eyes: Conjunctivae are normal.  Neck: Normal range  of motion.  Pulmonary/Chest: Effort normal.  Lymphadenopathy:       Head (right side): No submental, no submandibular, no tonsillar, no preauricular, no posterior auricular and no occipital adenopathy present.       Head (left side): No submental, no submandibular, no tonsillar, no preauricular, no posterior auricular and no occipital adenopathy present.    She has no cervical adenopathy.       Right: No supraclavicular adenopathy present.       Left: No supraclavicular adenopathy present.  Neurological: She is alert and oriented to person, place, and time.  Gait normal.  Skin: Skin is warm and dry.  Psychiatric: Affect normal.  Vitals reviewed.  Ear lavage attempt was not fully successful. Mild amount of cerumen was removed but there was still a moderate amount in the ear canal. Pt does report some relief with the amount we got out.   Assessment and Plan :   1. Impacted cerumen of left ear Referral to ENT as patient is still in moderate amount of discomfort and we could not successfully remove all of the cerumen. Encouraged to avoid the oil drops she was using prior to arrival and to avoid qtip use.  - Ear wax removal - Ambulatory referral to ENT - Care order/instruction:  2. Nasal congestion Will treat symptomatically at this time. Return to clinic if symptoms worsen, do not improve in 5-7 days, or as needed. - pseudoephedrine (SUDAFED 12 HOUR) 120 MG 12 hr tablet; Take 1 tablet (120 mg total) by mouth 2 (two) times daily.  Dispense: 14 tablet; Refill: 0 - fluticasone (FLONASE) 50 MCG/ACT nasal spray; Place 2 sprays into both nostrils daily.  Dispense: 16 g; Refill: 0  Benjiman CoreBrittany Ovida Delagarza, PA-C  [facility] 09/18/2016 5:35 PM

## 2017-05-02 ENCOUNTER — Ambulatory Visit: Payer: BC Managed Care – PPO | Admitting: Physician Assistant

## 2017-05-02 VITALS — BP 126/76 | HR 100 | Temp 98.8°F | Resp 16 | Ht 64.0 in | Wt 157.0 lb

## 2017-05-02 DIAGNOSIS — R52 Pain, unspecified: Secondary | ICD-10-CM

## 2017-05-02 DIAGNOSIS — J029 Acute pharyngitis, unspecified: Secondary | ICD-10-CM

## 2017-05-02 DIAGNOSIS — Z20818 Contact with and (suspected) exposure to other bacterial communicable diseases: Secondary | ICD-10-CM

## 2017-05-02 LAB — POCT INFLUENZA A/B
INFLUENZA B, POC: NEGATIVE
Influenza A, POC: NEGATIVE

## 2017-05-02 LAB — POCT RAPID STREP A (OFFICE): Rapid Strep A Screen: NEGATIVE

## 2017-05-02 MED ORDER — AMOXICILLIN 500 MG PO CAPS: 500.0000 mg | ORAL_CAPSULE | Freq: Two times a day (BID) | ORAL | 0 refills | Status: DC

## 2017-05-02 MED ORDER — IPRATROPIUM BROMIDE 0.03 % NA SOLN: 2.0000 | Freq: Two times a day (BID) | NASAL | 0 refills | Status: DC

## 2017-05-02 MED ORDER — GUAIFENESIN ER 1200 MG PO TB12: 1.0000 | ORAL_TABLET | Freq: Two times a day (BID) | ORAL | 1 refills | Status: DC | PRN

## 2017-05-02 MED ORDER — BENZONATATE 100 MG PO CAPS: 100.0000 mg | ORAL_CAPSULE | Freq: Three times a day (TID) | ORAL | 0 refills | Status: DC | PRN

## 2017-05-02 NOTE — Progress Notes (Signed)
[facility] [address], [place] [id number] [phone]  Date:  05/02/2017   Name:  Deborah Hughes   DOB:  06-18-1970   MRN:  [id number]  PCP:  Patient, No Pcp Per    History of Present Illness:  Deborah Hughes is a 47 y.o. female patient who presents to PCP with  Chief Complaint  Patient presents with  . Sore Throat    x 3 days  . Cough    x 3 days, dry cough  . Sinusitis    x 3 days, drainage in throat, headache  . Generalized Body Aches     3 days of symptoms of sinus symtpoms of congestion, runny nose, and some post nasal drip.  Has a cough non-productive.  There is sore throat.  No dyspnea or sob.  No fever that she can note but does have fever and chills.   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  She has also has exposure to influenza.  She did take 3 amoxillin pills that she had leftover.  Patient Active Problem List   Diagnosis Date Noted  . Dyspnea 08/04/2016  . Atypical chest pain 08/04/2016  . Chronic seasonal allergic rhinitis 08/04/2016  . Anemia, mild 04/01/2011    Past Medical History:  Diagnosis Date  . Allergic rhinitis   . Allergy   . Dysmenorrhea     Past Surgical History:  Procedure Laterality Date  . WISDOM TOOTH EXTRACTION      Social History   Tobacco Use  . Smoking status: Passive Smoke Exposure - Never Smoker  . Smokeless tobacco: Never Used  . Tobacco comment: Significant other.   Substance Use Topics  . Alcohol use: Yes    Comment: Social.   . Drug use: No    Family History  Problem Relation Age of Onset  . Diabetes Other   . Rheum arthritis Other   . High Cholesterol Mother   . Glaucoma Mother   . Rheum arthritis Mother   . Stroke Father   . High Cholesterol Father   . Diabetes Father   . Ovarian cancer Paternal Grandmother   . Colon cancer Paternal Grandfather   . Uterine cancer Maternal Grandmother   . Prostate cancer Maternal Grandfather   . Lung disease Neg Hx      Allergies  Allergen Reactions  . Shellfish Allergy     Medication list has been reviewed and updated.  Current Outpatient Medications on File Prior to Visit  Medication Sig Dispense Refill  . fluticasone (FLONASE) 50 MCG/ACT nasal spray Place 2 sprays into both nostrils daily. 16 g 0  . ibuprofen (ADVIL,MOTRIN) 800 MG tablet Take 800 mg by mouth every 8 (eight) hours as needed.    . magnesium gluconate (MAGONATE) 500 MG tablet Take 500 mg by mouth 3 (three) times daily.    . pseudoephedrine (SUDAFED 12 HOUR) 120 MG 12 hr tablet Take 1 tablet (120 mg total) by mouth 2 (two) times daily. 14 tablet 0  . Red Yeast Rice Extract (RED YEAST RICE PO) Take 1 tablet by mouth daily.     No current facility-administered medications on file prior to visit.     ROS ROS otherwise unremarkable unless listed above.  Physical Examination: BP 126/76   Pulse 100   Temp 98.8 F (37.1 C) (Oral)   Resp 16   Ht 5\' 4"  (1.626 m)   Wt 157 lb (71.2 kg)   LMP 04/03/2017  SpO2 98%   BMI 26.95 kg/m  Ideal Body Weight: Weight in (lb) to have BMI = 25: 145.3  Physical Exam  Constitutional: She is oriented to person, place, and time. She appears well-developed and well-nourished. No distress.  HENT:  Head: Normocephalic and atraumatic.  Right Ear: Tympanic membrane, external ear and ear canal normal.  Left Ear: Tympanic membrane, external ear and ear canal normal.  Nose: Mucosal edema and rhinorrhea present. Right sinus exhibits no maxillary sinus tenderness and no frontal sinus tenderness. Left sinus exhibits no maxillary sinus tenderness and no frontal sinus tenderness.  Mouth/Throat: No uvula swelling. No oropharyngeal exudate, posterior oropharyngeal edema or posterior oropharyngeal erythema.  Eyes: Conjunctivae and EOM are normal. Pupils are equal, round, and reactive to light.  Cardiovascular: Normal rate and regular rhythm. Exam reveals no gallop, no distant heart sounds and no friction rub.   No murmur heard. Pulmonary/Chest: Effort normal. No respiratory distress. She has no decreased breath sounds. She has no wheezes. She has no rhonchi.  Lymphadenopathy:       Head (right side): No submandibular, no tonsillar, no preauricular and no posterior auricular adenopathy present.       Head (left side): No submandibular, no tonsillar, no preauricular and no posterior auricular adenopathy present.    She has no cervical adenopathy.  Neurological: She is alert and oriented to person, place, and time.  Skin: She is not diaphoretic.  Psychiatric: She has a normal mood and affect. Her behavior is normal.   Results for orders placed or performed in visit on 05/02/17  POCT rapid strep A  Result Value Ref Range   Rapid Strep A Screen Negative Negative  POCT Influenza A/B  Result Value Ref Range   Influenza A, POC Negative Negative   Influenza B, POC Negative Negative     Assessment and Plan: Deborah Hughes is a 47 y.o. female who is here today for cc of  Chief Complaint  Patient presents with  . Sore Throat    x 3 days  . Cough    x 3 days, dry cough  . Sinusitis    x 3 days, drainage in throat, headache  . Generalized Body Aches  given strep exposure, will treat.  Advised the misuse of abx and having abx resistance.  This is likely not strep, however having an accurage measurement of this is going to be non-conclusive given abx use.  Will finish treatment. Sore throat - Plan: POCT rapid strep A, ipratropium (ATROVENT) 0.03 % nasal spray, benzonatate (TESSALON) 100 MG capsule, Guaifenesin (MUCINEX MAXIMUM STRENGTH) 1200 MG TB12, amoxicillin (AMOXIL) 500 MG capsule, CANCELED: Culture, Group A Strep  Generalized body aches - Plan: POCT Influenza A/B, ipratropium (ATROVENT) 0.03 % nasal spray, benzonatate (TESSALON) 100 MG capsule, Guaifenesin (MUCINEX MAXIMUM STRENGTH) 1200 MG TB12, amoxicillin (AMOXIL) 500 MG capsule  Exposure to strep throat - Plan: amoxicillin (AMOXIL) 500 MG  capsule  Deborah Platt, PA-C [facility] 3/19/20194:33 PM

## 2017-05-02 NOTE — Patient Instructions (Signed)
Upper Respiratory Infection, Adult Most upper respiratory infections (URIs) are caused by a virus. A URI affects the nose, throat, and upper air passages. The most common type of URI is often called "the common cold." Follow these instructions at home:  Take medicines only as told by your doctor.  Gargle warm saltwater or take cough drops to comfort your throat as told by your doctor.  Use a warm mist humidifier or inhale steam from a shower to increase air moisture. This may make it easier to breathe.  Drink enough fluid to keep your pee (urine) clear or pale yellow.  Eat soups and other clear broths.  Have a healthy diet.  Rest as needed.  Go back to work when your fever is gone or your doctor says it is okay. ? You may need to stay home longer to avoid giving your URI to others. ? You can also wear a face mask and wash your hands often to prevent spread of the virus.  Use your inhaler more if you have asthma.  Do not use any tobacco products, including cigarettes, chewing tobacco, or electronic cigarettes. If you need help quitting, ask your doctor. Contact a doctor if:  You are getting worse, not better.  Your symptoms are not helped by medicine.  You have chills.  You are getting more short of breath.  You have brown or red mucus.  You have yellow or brown discharge from your nose.  You have pain in your face, especially when you bend forward.  You have a fever.  You have puffy (swollen) neck glands.  You have pain while swallowing.  You have white areas in the back of your throat. Get help right away if:  You have very bad or constant: ? Headache. ? Ear pain. ? Pain in your forehead, behind your eyes, and over your cheekbones (sinus pain). ? Chest pain.  You have long-lasting (chronic) lung disease and any of the following: ? Wheezing. ? Long-lasting cough. ? Coughing up blood. ? A change in your usual mucus.  You have a stiff neck.  You have  changes in your: ? Vision. ? Hearing. ? Thinking. ? Mood. This information is not intended to replace advice given to you by your health care provider. Make sure you discuss any questions you have with your health care provider. Document Released: 07/21/2007 Document Revised: 10/05/2015 Document Reviewed: 05/09/2013 Elsevier Interactive Patient Education  2018 Elsevier Inc.  

## 2017-05-03 ENCOUNTER — Encounter: Payer: Self-pay | Admitting: Physician Assistant

## 2017-05-04 LAB — CULTURE, GROUP A STREP: STREP A CULTURE: NEGATIVE

## 2017-05-25 ENCOUNTER — Encounter: Payer: Self-pay | Admitting: Physician Assistant

## 2018-02-24 ENCOUNTER — Other Ambulatory Visit: Payer: Self-pay | Admitting: Obstetrics and Gynecology

## 2018-02-24 DIAGNOSIS — Z1231 Encounter for screening mammogram for malignant neoplasm of breast: Secondary | ICD-10-CM

## 2018-03-27 ENCOUNTER — Ambulatory Visit: Payer: BC Managed Care – PPO

## 2018-04-03 ENCOUNTER — Ambulatory Visit
Admission: RE | Admit: 2018-04-03 | Discharge: 2018-04-03 | Disposition: A | Discharge status: Home / Self Care | Payer: BC Managed Care – PPO | Source: Ambulatory Visit | Attending: Obstetrics and Gynecology | Admitting: Obstetrics and Gynecology

## 2018-04-03 ENCOUNTER — Encounter: Payer: Self-pay | Admitting: Radiology

## 2018-04-03 DIAGNOSIS — Z1231 Encounter for screening mammogram for malignant neoplasm of breast: Secondary | ICD-10-CM

## 2019-02-19 ENCOUNTER — Other Ambulatory Visit: Payer: Self-pay | Admitting: Obstetrics and Gynecology

## 2019-02-19 DIAGNOSIS — Z1231 Encounter for screening mammogram for malignant neoplasm of breast: Secondary | ICD-10-CM

## 2019-04-05 ENCOUNTER — Ambulatory Visit: Payer: BC Managed Care – PPO

## 2019-05-08 ENCOUNTER — Ambulatory Visit
Admission: RE | Admit: 2019-05-08 | Discharge: 2019-05-08 | Disposition: A | Discharge status: Home / Self Care | Payer: BC Managed Care – PPO | Source: Ambulatory Visit | Attending: Obstetrics and Gynecology | Admitting: Obstetrics and Gynecology

## 2019-05-08 ENCOUNTER — Other Ambulatory Visit: Payer: Self-pay

## 2019-05-08 DIAGNOSIS — Z1231 Encounter for screening mammogram for malignant neoplasm of breast: Secondary | ICD-10-CM

## 2020-02-11 ENCOUNTER — Other Ambulatory Visit: Payer: Self-pay

## 2020-02-11 ENCOUNTER — Encounter: Payer: Self-pay | Admitting: Endocrinology

## 2020-02-11 ENCOUNTER — Ambulatory Visit (INDEPENDENT_AMBULATORY_CARE_PROVIDER_SITE_OTHER): Payer: BC Managed Care – PPO | Admitting: Endocrinology

## 2020-02-11 VITALS — BP 114/78 | HR 81 | Ht 64.0 in | Wt 181.4 lb

## 2020-02-11 DIAGNOSIS — R7303 Prediabetes: Secondary | ICD-10-CM

## 2020-02-11 DIAGNOSIS — R5383 Other fatigue: Secondary | ICD-10-CM

## 2020-02-11 DIAGNOSIS — Z6831 Body mass index (BMI) 31.0-31.9, adult: Secondary | ICD-10-CM

## 2020-02-11 DIAGNOSIS — E7800 Pure hypercholesterolemia, unspecified: Secondary | ICD-10-CM | POA: Insufficient documentation

## 2020-02-11 LAB — T4, FREE: Free T4: 0.64 ng/dL (ref 0.60–1.60)

## 2020-02-11 LAB — CBC
HCT: 39.3 % (ref 36.0–46.0)
Hemoglobin: 12.7 g/dL (ref 12.0–15.0)
MCHC: 32.3 g/dL (ref 30.0–36.0)
MCV: 87.6 fl (ref 78.0–100.0)
Platelets: 206 10*3/uL (ref 150.0–400.0)
RBC: 4.49 Mil/uL (ref 3.87–5.11)
RDW: 14 % (ref 11.5–15.5)
WBC: 7 10*3/uL (ref 4.0–10.5)

## 2020-02-11 LAB — TSH: TSH: 1.1 u[IU]/mL (ref 0.35–4.50)

## 2020-02-11 LAB — POCT GLYCOSYLATED HEMOGLOBIN (HGB A1C): Hemoglobin A1C: 5.5 % (ref 4.0–5.6)

## 2020-02-11 LAB — GLUCOSE, POCT (MANUAL RESULT ENTRY): POC Glucose: 94 mg/dl (ref 70–99)

## 2020-02-11 NOTE — Progress Notes (Signed)
Patient ID: Deborah Hughes, female   DOB: 03-28-1970, 49 y.o.   MRN: [id number]           Reason for Appointment: Prediabetes  Referring PCP: Delma Officer, PA   History of Present Illness:          Date of diagnosis of prediabetes: 2019  Background history:   She may have had an abnormal A1c done by her gynecologist in the past  Recent history:   Most recent A1c is 5.7 done on 03/06/2019 and was the same in 2020 Previously had a fasting glucose of 102 on 04/12/2019, otherwise no records of high blood sugars are available   A1c today is 5.5  Current history   She has been referred here by her PCP, previously was referred to her by the gynecologist  Also the patient is concerned that she wants a GI specialist for her prediabetes because of her strong family history  She was concerned that she is feeling tired and not feeling good overall because of her glucose levels  She has gained a significant amount of weight about 24 pounds in the last 1 to 2 years  She thinks this is from her sedentary job, lack of exercise, not eating healthy meals  She is having a busy work schedule and will frequently eat fast food in the morning and not plan her meals  Today her blood sugar is 94 but she has not eaten all day  She is reluctant to start any medications for her glucose abnormalities     Typical meal intake: Breakfast is sausage biscuit/scrambled egg and cheese Snacks are chips, nuts, fruit and yogurt Usually avoiding regular drinks               Exercise:  Some walking intermittently, yoga  Glucose monitoring:  none   Dietician visit, most recent: 2020  Weight history:  Wt Readings from Last 3 Encounters:  02/11/20 181 lb 6.4 oz (82.3 kg)  05/02/17 157 lb (71.2 kg)  09/18/16 158 lb 4 oz (71.8 kg)    Glycemic control:  No results found for: HGBA1C No results found for: GLUF, MICROALBUR, LDLCALC, CREATININE No results found for: MICRALBCREAT  No results found  for: FRUCTOSAMINE  No visits with results within 1 Week(s) from this visit.  Latest known visit with results is:  Office Visit on 05/02/2017  Component Date Value Ref Range Status  . Rapid Strep A Screen 05/02/2017 Negative  Negative Final  . Influenza A, POC 05/02/2017 Negative  Negative Final  . Influenza B, POC 05/02/2017 Negative  Negative Final  . Strep A Culture 05/02/2017 Negative   Final    Allergies as of 02/11/2020      Reactions   Shellfish Allergy       Medication List       Accurate as of February 11, 2020  4:32 PM. If you have any questions, ask your nurse or doctor.        amoxicillin 500 MG capsule Commonly known as: AMOXIL Take 1 capsule (500 mg total) by mouth 2 (two) times daily.   benzonatate 100 MG capsule Commonly known as: TESSALON Take 1-2 capsules (100-200 mg total) by mouth 3 (three) times daily as needed for cough.   fluticasone 50 MCG/ACT nasal spray Commonly known as: FLONASE Place 2 sprays into both nostrils daily.   Guaifenesin 1200 MG Tb12 Commonly known as: Mucinex Maximum Strength Take 1 tablet (1,200 mg total) by mouth every 12 (twelve) hours as  needed.   ibuprofen 800 MG tablet Commonly known as: ADVIL Take 800 mg by mouth every 8 (eight) hours as needed.   ipratropium 0.03 % nasal spray Commonly known as: ATROVENT Place 2 sprays into both nostrils 2 (two) times daily.   magnesium gluconate 500 MG tablet Commonly known as: MAGONATE Take 500 mg by mouth 3 (three) times daily.   pseudoephedrine 120 MG 12 hr tablet Commonly known as: Sudafed 12 Hour Take 1 tablet (120 mg total) by mouth 2 (two) times daily.   RED YEAST RICE PO Take 1 tablet by mouth daily.       Allergies:  Allergies  Allergen Reactions  . Shellfish Allergy     Past Medical History:  Diagnosis Date  . Allergic rhinitis   . Allergy   . Dysmenorrhea     Past Surgical History:  Procedure Laterality Date  . WISDOM TOOTH EXTRACTION      Family  History  Problem Relation Age of Onset  . Diabetes Other   . Rheum arthritis Other   . High Cholesterol Mother   . Glaucoma Mother   . Rheum arthritis Mother   . Stroke Father   . High Cholesterol Father   . Diabetes Father   . Ovarian cancer Paternal Grandmother   . Colon cancer Paternal Grandfather   . Uterine cancer Maternal Grandmother   . Prostate cancer Maternal Grandfather   . Diabetes Paternal Aunt   . Lung disease Neg Hx     Social History:  reports that she is a non-smoker but has been exposed to tobacco smoke. She has never used smokeless tobacco. She reports current alcohol use. She reports that she does not use drugs.   Review of Systems  Constitutional: Positive for weight gain.  Eyes: Negative for blurred vision.  Cardiovascular: Negative for leg swelling.  Endocrine: Positive for fatigue.       She has been menopausal for at least a year.  Only has rare hot flashes or feeling hot  Genitourinary: Negative for frequency.  Musculoskeletal: Negative for joint pain.  Neurological: Negative for numbness and tingling.  Psychiatric/Behavioral: Negative for depressed mood.     Lipid history:    No results found for: CHOL, HDL, LDLCALC, LDLDIRECT, TRIG, CHOLHDL         Hypertension: Has been present  BP Readings from Last 3 Encounters:  02/11/20 114/78  05/02/17 126/76  09/18/16 96/61    Most recent eye exam was in 12/20  Most recent foot exam:  Currently known complications of diabetes:  LABS:  No visits with results within 1 Week(s) from this visit.  Latest known visit with results is:  Office Visit on 05/02/2017  Component Date Value Ref Range Status  . Rapid Strep A Screen 05/02/2017 Negative  Negative Final  . Influenza A, POC 05/02/2017 Negative  Negative Final  . Influenza B, POC 05/02/2017 Negative  Negative Final  . Strep A Culture 05/02/2017 Negative   Final    Physical Examination:  BP 114/78   Pulse 81   Ht 5\' 4"  (1.626 m)   Wt  181 lb 6.4 oz (82.3 kg)   SpO2 97%   BMI 31.14 kg/m   GENERAL:         Patient has abdominal obesity.    HEENT:         Eye exam shows normal external appearance.  Fundus exam not indicated Oral exam deferred   NECK:  No acanthosis present  There is no lymphadenopathy  Thyroid is not enlarged and no nodules felt.   Carotids are normal to palpation and no bruit heard  LUNGS:         Chest is symmetrical. Lungs are clear to auscultation.Marland Kitchen   HEART:         Heart sounds:  S1 and S2 are normal. No murmur or click heard., no S3 or S4.    ABDOMEN:   There is no distention present. Liver and spleen are not palpable.  No other mass or tenderness present.    NEUROLOGICAL:   Ankle jerks are absent bilaterally.   Biceps reflexes are normal  Diabetic Foot Exam - Simple   Simple Foot Form Diabetic Foot exam was performed with the following findings: Yes   Visual Inspection No deformities, no ulcerations, no other skin breakdown bilaterally: Yes Sensation Testing Intact to touch and monofilament testing bilaterally: Yes Pulse Check Posterior Tibialis and Dorsalis pulse intact bilaterally: Yes Comments             MUSCULOSKELETAL:  There is no swelling or deformity of the peripheral joints.     EXTREMITIES:     There is no ankle edema.   SKIN:       No rash or lesions of concern.        ASSESSMENT:  Early prediabetes  Her highest A1c has been 5.7 and highest blood sugar ( ?  Fasting) available from outside records is 102 Patient is concerned about her family history and her propensity to have diabetes However reassured her that her glucose and A1c today indicates she is not prediabetic at this time  OBESITY: She has abdominal obesity and BMI of 31 Previously has been able to keep her weight down with lifestyle changes with excellent diet and significant amount of exercise which she is not doing now  FATIGUE: Reassured her that this is not related to glucose abnormalities and  is likely multifactorial related to stress, work routine, lack of exercise but will need to rule out hypothyroidism and anemia  PLAN:     Consultation with nutritionist for meal planning  Discussed need to cut back on any high fat or high carbohydrate meals and snacks  She does need to start regular exercise regimen and can start doing an aerobic program during her lunch since she did not have time to do that in the morning or evenings  Check thyroid levels and CBC for fatigue  Follow-up: Annually    There are no Patient Instructions on file for this visit.   Consultation note has been sent to the referring physician  Reather Littler 02/11/2020, 4:32 PM   Note: This office note was prepared with Dragon voice recognition system technology. Any transcriptional errors that result from this process are unintentional.

## 2020-02-12 NOTE — Progress Notes (Signed)
Please call to let patient know that the lab results are normal Send to PCP

## 2020-02-13 ENCOUNTER — Encounter: Payer: Self-pay | Admitting: Registered"

## 2020-02-13 ENCOUNTER — Encounter: Payer: BC Managed Care – PPO | Attending: Endocrinology | Admitting: Registered"

## 2020-02-13 ENCOUNTER — Other Ambulatory Visit: Payer: Self-pay

## 2020-02-13 DIAGNOSIS — R7303 Prediabetes: Secondary | ICD-10-CM | POA: Diagnosis present

## 2020-02-13 NOTE — Progress Notes (Signed)
Medical Nutrition Therapy  Appointment Start time:  8:09  Appointment End time:  9:16  Primary concerns today: prediabetes  Referral diagnosis: prediabetes Preferred learning style: no preference indicated Learning readiness: ready, change in progress   NUTRITION ASSESSMENT   Pt arrives stating she has been feeling really bad; sluggish. States she is entering into menopause. Reports 2021 is first year of not having a menstrual cycle. States she realizes her lifestyle needs to change. Reports she tries to sleep as late as possible, wakes up around 7 am-ish to get to work around 8 am. Works Mon-Fri 8-7 pm but will do work-related tasks outside of those hours and on weekends to meet deadlines. Reports she is scheduled to work Mon-Fri 8-5 pm. States once she leaves work, she will stop and [facility] from [employer], [employer], or [employer]. Reports she then works on school work; currently in grad school Theme park manager. States she has 3 more semesters of school. Will work on school assignments until 2 am and do it all over again. Reports she cooks more on weekends when she feels like it. Reports she typically skips breakfast and lunch during the week and snacks during the day. States she enjoys food. Reports she does not have time to cook. Will cook sometimes on Sundays.   Reports she lost a lot of weight during 2017; from 181-160 lbs. States she loves [employer]; will get kids meal for portion control or will have Cobb salad with nuggets. Reports she loves [employer] and likes Zoe's as well. Reports she will eat more meals on weekends: breakfast, lunch, and dinner. Will include vegetables on weekends. States she will cook lasagna, meatloaf, or stir fry.   States she has 2 younger sisters and mom. Able to wear their clothes. Reports she is frustrated that her clothes are not fitting the same due to weight gain.   States she participates in hot yoga on weekends. Walks 3.1 miles on weekends at local  park.   Reports dad and his siblings have/had diabetes. Expresses some concern related to family history of diabetes.    Clinical Medical Hx: hypercholesterolemia, prediabetes Medications: See list Labs: recent A1c 5.5 (01/2020); decreased from 5.8 (12/2015), Chol 194 (12/2015), Trg 44 (12/2015) Notable Signs/Symptoms: fatigue, challenges with focus/concentration  Lifestyle & Dietary Hx  Estimated daily fluid intake: 40-54 oz Supplements: magnesium, wheat grass, Vitamin C, D3, B12, Zinc, Rice grass  Sleep: 5 hours Stress / self-care: mild currently due to being on break from school and work; high stress when work and school are in session Current average weekly physical activity: hot yoga, walking 1-2x/week  24-Hr Dietary Recall First Meal: typically skips; coffee (2 sugar packs + creamer) or [place]-sausage + eggs or sausage biscuit + eggs + cheese Snack:  Second Meal: typically skips or fast food Snack: nuts + apple slices + PB or yogurt  Third Meal: Zoe's, [employer], [employer], [employer] (1/2 sandwich, soup/salad, chips)  Snack:  Beverages: water (2-3*16 oz;32-48 oz) coffee, tea, ginger ale   NUTRITION DIAGNOSIS  NB-1.1 Food and nutrition-related knowledge deficit As related to prediabetes.  As evidenced by lack of previous nutrition-related education.   NUTRITION INTERVENTION  Nutrition education (E-1) on the following topics:  Pt was educated and counseled on metabolism, importance of not skipping meals, effects of dieting/restriction, how carbohydrates work in the body, role of fiber in eating regimen, and importance of physical activity. Discussed meal/snack planning and how to balance meals/snacks using MyPlate for prediabetes.  Handouts Provided Include   MyPlate for Prediabetes  Learning Style & Readiness for Change Teaching method utilized: Visual & Auditory  Demonstrated degree of understanding via: Teach Back  Barriers to learning/adherence to lifestyle  change: work-life balance  Goals Established by Pt  Check into meal delivery services such as Hello Fresh or Blue Apron for during the week meals to save time and create healthy balance.   Check out social media pages to help with setting boundaries.   Seek mental health professional: [facility] [phone].  Aim to have breakfast, lunch, and dinner. Include 1/2 plate as non-starchy vegetables, 1/4 plate as starch/grain, and 1/4 plate as protein.   Snacks include a source of carbohydrates + protein such as:  Cheese and crackers  Peanut butter and crackers  Fruit and peanut butter  Fruit and nuts   MONITORING & EVALUATION Dietary intake and weekly physical activity prn.  Next Steps  Patient is to call and reschedule follow-up appt as needed.

## 2020-02-13 NOTE — Patient Instructions (Addendum)
-   Check into meal delivery services such as Hello Fresh or Blue Apron for during the week meals to save time and create healthy balance.   - Check out social media pages to help with setting boundaries.   - Seek mental health professional: [facility] [phone].  - Aim to have breakfast, lunch, and dinner. Include 1/2 plate as non-starchy vegetables, 1/4 plate as starch/grain, and 1/4 plate as protein.   - Snacks include a source of carbohydrates + protein such as:  Cheese and crackers  Peanut butter and crackers  Fruit and peanut butter  Fruit and nuts

## 2020-10-09 ENCOUNTER — Other Ambulatory Visit: Payer: Self-pay | Admitting: Physician Assistant

## 2020-10-09 ENCOUNTER — Other Ambulatory Visit: Payer: Self-pay

## 2020-10-09 ENCOUNTER — Ambulatory Visit
Admission: RE | Admit: 2020-10-09 | Discharge: 2020-10-09 | Disposition: A | Discharge status: Home / Self Care | Payer: BC Managed Care – PPO | Source: Ambulatory Visit | Attending: Physician Assistant | Admitting: Physician Assistant

## 2020-10-09 DIAGNOSIS — Z1231 Encounter for screening mammogram for malignant neoplasm of breast: Secondary | ICD-10-CM

## 2021-01-26 ENCOUNTER — Other Ambulatory Visit: Payer: Self-pay | Admitting: Endocrinology

## 2021-01-26 DIAGNOSIS — R7301 Impaired fasting glucose: Secondary | ICD-10-CM

## 2021-01-27 ENCOUNTER — Other Ambulatory Visit: Payer: Self-pay

## 2021-02-03 ENCOUNTER — Ambulatory Visit: Payer: Self-pay | Admitting: Endocrinology

## 2021-05-01 ENCOUNTER — Other Ambulatory Visit: Payer: Self-pay | Admitting: Physician Assistant

## 2021-05-01 DIAGNOSIS — Z1231 Encounter for screening mammogram for malignant neoplasm of breast: Secondary | ICD-10-CM

## 2021-07-06 ENCOUNTER — Encounter: Payer: Self-pay | Admitting: Podiatry

## 2021-07-06 ENCOUNTER — Ambulatory Visit (INDEPENDENT_AMBULATORY_CARE_PROVIDER_SITE_OTHER): Payer: BC Managed Care – PPO

## 2021-07-06 ENCOUNTER — Ambulatory Visit: Payer: BC Managed Care – PPO | Admitting: Podiatry

## 2021-07-06 DIAGNOSIS — M722 Plantar fascial fibromatosis: Secondary | ICD-10-CM

## 2021-07-06 DIAGNOSIS — M7750 Other enthesopathy of unspecified foot: Secondary | ICD-10-CM | POA: Diagnosis not present

## 2021-07-06 DIAGNOSIS — M7752 Other enthesopathy of left foot: Secondary | ICD-10-CM

## 2021-07-06 DIAGNOSIS — M7742 Metatarsalgia, left foot: Secondary | ICD-10-CM

## 2021-07-06 DIAGNOSIS — Q6672 Congenital pes cavus, left foot: Secondary | ICD-10-CM

## 2021-07-06 MED ORDER — MELOXICAM 15 MG PO TABS: 15.0000 mg | ORAL_TABLET | Freq: Every day | ORAL | 3 refills | Status: DC

## 2021-07-06 NOTE — Patient Instructions (Signed)

## 2021-07-06 NOTE — Progress Notes (Signed)
  Subjective:  Patient ID: Deborah Hughes, female    DOB: 18-Mar-1970,  MRN: [id number]  Chief Complaint  Patient presents with   Foot Pain      (NP) LEFT FOOT  -  FELT LIKE SOMETHING POPPED IN FOOT - CONSTANT PAIN    51 y.o. female presents with the above complaint. History confirmed with patient.  She had to go to an event in February where she wore black tall heeled boots and had to stand on her feet for a long time.  She is in a high intensity training program but cannot exercise class and it has not been able to heal.  She has tried heat massage and stretching and this is helped some.  Most the pain is in the arch and heel  Objective:  Physical Exam: warm, good capillary refill, no trophic changes or ulcerative lesions, normal DP and PT pulses, normal sensory exam, and she has pes cavus foot type bilateral, there is tenderness in the mid plantar fascia plantar heel on the left foot no instability ecchymosis or bruising no masses noted and plantar fascia.   Radiographs: Multiple views x-ray of the left foot: Pes cavus alignment there is no fracture degenerative changes noted there is a posterior calcaneal enthesophyte and Haglund deformity Assessment:   1. Plantar fasciitis of left foot   2. Pes cavus of left foot   3. Metatarsalgia of left foot      Plan:  Patient was evaluated and treated and all questions answered.  Suspect she has a combination of Plantar fasciitis and metatarsalgia secondary to a pes cavus foot type and overuse injury at the event she went to.  Seems to be improving slightly.  Not exquisitely tender today.  Would hold off on injection I prescribed her meloxicam and she will take this for 30 days consistently.  I did recommend custom-molded orthoses I think her pes cavus foot type requires more support than a prefabricated orthosis could offer.  She was seen by our pedorthist for this today.  Home therapy plan exercises given.  I will see her back in 1 month.  If  not improving would recommend injection at that point.  Return in about 1 month (around 08/06/2021) for recheck plantar fasciitis.

## 2021-07-06 NOTE — Progress Notes (Signed)
SITUATION Reason for Consult: Evaluation for Bilateral Custom Foot Orthoses Patient / Caregiver Report: Patient is ready for foot orthotics  OBJECTIVE DATA: Patient History / Diagnosis:    ICD-10-CM   1. Plantar fasciitis of left foot  M72.2     2. Pes cavus of left foot  Q66.72     3. Metatarsalgia of left foot  M77.42       Current or Previous Devices:   None and no history  Foot Examination: Skin presentation:   Intact Ulcers & Callousing:   None Toe / Foot Deformities:  Pes cavus Weight Bearing Presentation:  Cavus Sensation:    Intact  Shoe Size:    61M  ORTHOTIC RECOMMENDATION Recommended Device: 1x pair of custom functional foot orthotics  GOALS OF ORTHOSES - Reduce Pain - Prevent Foot Deformity - Prevent Progression of Further Foot Deformity - Relieve Pressure - Improve the Overall Biomechanical Function of the Foot and Lower Extremity.  ACTIONS PERFORMED Potential out of pocket cost was communicated to patient. Patient understood and consent to casting. Patient was casted for Foot Orthoses via crush box. Procedure was explained and patient tolerated procedure well. Casts were shipped to central fabrication. All questions were answered and concerns addressed.  PLAN Patient is to be called for fitting when devices are ready.

## 2021-08-06 ENCOUNTER — Ambulatory Visit: Payer: BC Managed Care – PPO | Admitting: Podiatry

## 2021-08-19 ENCOUNTER — Ambulatory Visit: Payer: BC Managed Care – PPO | Admitting: Podiatry

## 2021-08-19 ENCOUNTER — Encounter: Payer: Self-pay | Admitting: Podiatry

## 2021-08-19 ENCOUNTER — Ambulatory Visit (INDEPENDENT_AMBULATORY_CARE_PROVIDER_SITE_OTHER): Payer: BC Managed Care – PPO | Admitting: Podiatry

## 2021-08-19 DIAGNOSIS — M722 Plantar fascial fibromatosis: Secondary | ICD-10-CM | POA: Diagnosis not present

## 2021-08-19 DIAGNOSIS — N946 Dysmenorrhea, unspecified: Secondary | ICD-10-CM | POA: Insufficient documentation

## 2021-08-19 DIAGNOSIS — N951 Menopausal and female climacteric states: Secondary | ICD-10-CM | POA: Insufficient documentation

## 2021-08-19 DIAGNOSIS — M7742 Metatarsalgia, left foot: Secondary | ICD-10-CM

## 2021-08-19 DIAGNOSIS — E559 Vitamin D deficiency, unspecified: Secondary | ICD-10-CM | POA: Insufficient documentation

## 2021-08-19 DIAGNOSIS — R400 Somnolence: Secondary | ICD-10-CM | POA: Insufficient documentation

## 2021-08-19 DIAGNOSIS — N9489 Other specified conditions associated with female genital organs and menstrual cycle: Secondary | ICD-10-CM | POA: Insufficient documentation

## 2021-08-19 DIAGNOSIS — H0520 Unspecified exophthalmos: Secondary | ICD-10-CM | POA: Insufficient documentation

## 2021-08-19 DIAGNOSIS — E669 Obesity, unspecified: Secondary | ICD-10-CM | POA: Insufficient documentation

## 2021-08-19 DIAGNOSIS — G9332 Myalgic encephalomyelitis/chronic fatigue syndrome: Secondary | ICD-10-CM | POA: Insufficient documentation

## 2021-08-19 NOTE — Progress Notes (Signed)
Patient presents today for orthotic pick up. Patient voices no new complaints.  Orthotics were fitted to the patient's feet. No discomfort and no rubbing. Patient satisfied with the orthotics.  Orthotics were dispensed to patient with instructions for break in wear and to call the office with any concerns or questions. 

## 2021-08-19 NOTE — Patient Instructions (Signed)

## 2021-08-19 NOTE — Progress Notes (Signed)
  Subjective:  Patient ID: Deborah Hughes, female    DOB: [birth year],  MRN: [id number]  Chief Complaint  Patient presents with   Plantar Fasciitis        recheck PF 4 week follow up, seeing Arlys John at 915a    51 y.o. female presents with the above complaint. History confirmed with patient.  She is doing much better its nearly fully resolved at this point.  She received her orthotics today.  She is no longer having take the Mobic  Objective:  Physical Exam: warm, good capillary refill, no trophic changes or ulcerative lesions, normal DP and PT pulses, normal sensory exam, and she has pes cavus foot type bilateral, she has no pain or tenderness in either foot today   Radiographs: Multiple views x-ray of the left foot: Pes cavus alignment there is no fracture degenerative changes noted there is a posterior calcaneal enthesophyte and Haglund deformity Assessment:   1. Plantar fasciitis of left foot      Plan:  Patient was evaluated and treated and all questions answered.  Do much better has nearly completely resolved with home physical therapy and meloxicam.  She received her orthotics today.  She will return to see me as needed if it returns or worsens we reviewed restarting her home therapy plan if it does and starting the Mobic and she will get her next refill.  We reviewed the break-in period and discussed if she needs adjustments of the orthotics I can handle this for her.  Return if symptoms worsen or fail to improve.

## 2021-08-20 ENCOUNTER — Other Ambulatory Visit: Payer: BC Managed Care – PPO

## 2021-10-12 ENCOUNTER — Ambulatory Visit
Admission: RE | Admit: 2021-10-12 | Discharge: 2021-10-12 | Disposition: A | Discharge status: Home / Self Care | Payer: BC Managed Care – PPO | Source: Ambulatory Visit | Attending: Physician Assistant | Admitting: Physician Assistant

## 2021-10-12 DIAGNOSIS — Z1231 Encounter for screening mammogram for malignant neoplasm of breast: Secondary | ICD-10-CM

## 2022-06-11 IMAGING — MG MM DIGITAL SCREENING BILAT W/ TOMO AND CAD
8 series · 8 of 24 positions shown · non-contrast
Comparison: Previous exam(s).

CLINICAL DATA: Screening.

EXAM:
DIGITAL SCREENING BILATERAL MAMMOGRAM WITH TOMOSYNTHESIS AND CAD
TECHNIQUE: Bilateral screening digital craniocaudal and mediolateral oblique
mammograms were obtained. Bilateral screening digital breast
tomosynthesis was performed. The images were evaluated with
computer-aided detection.

[R CC synth-2D]
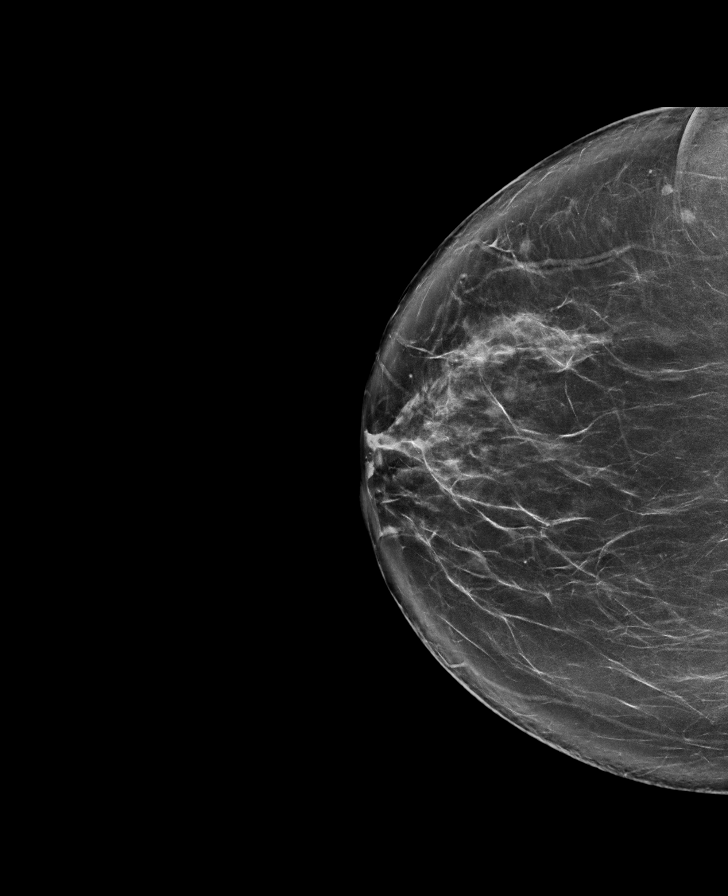

[R MLO synth-2D]
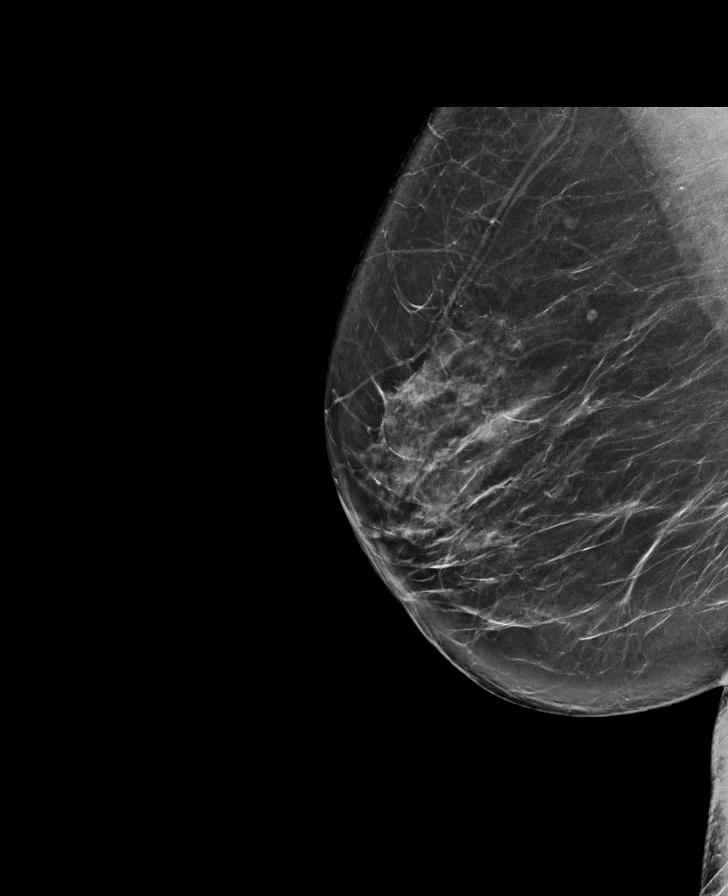

[L MLO synth-2D]
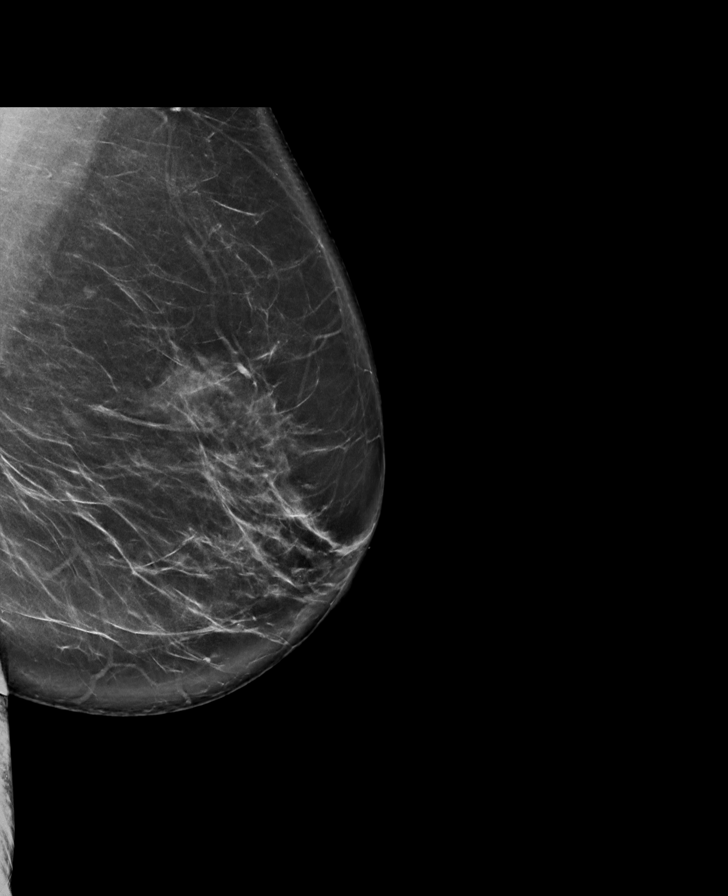

[L CC synth-2D]
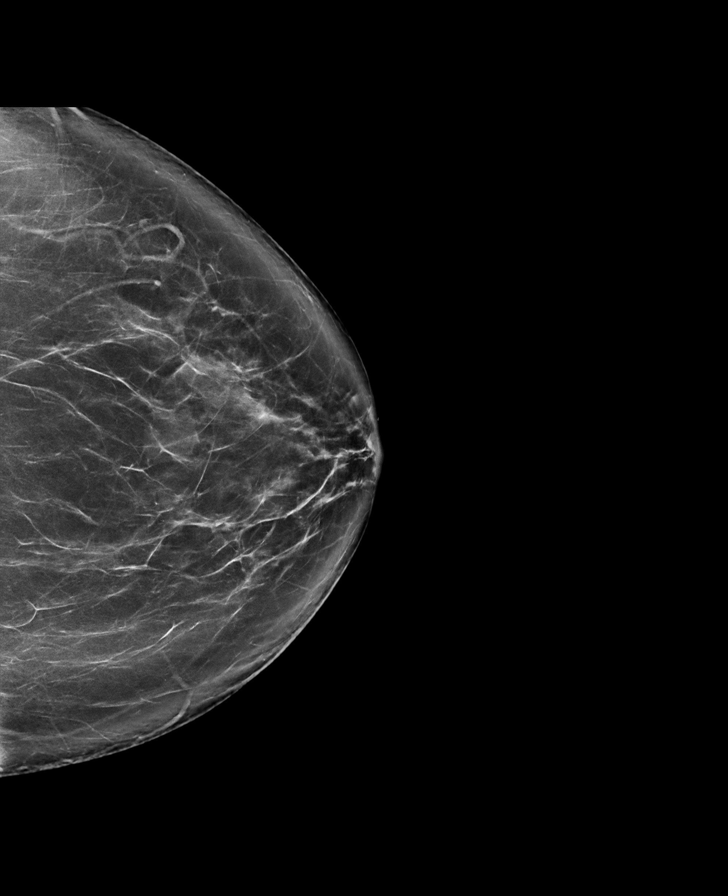

[L MLO tomo · tomo slice 47/92.0]
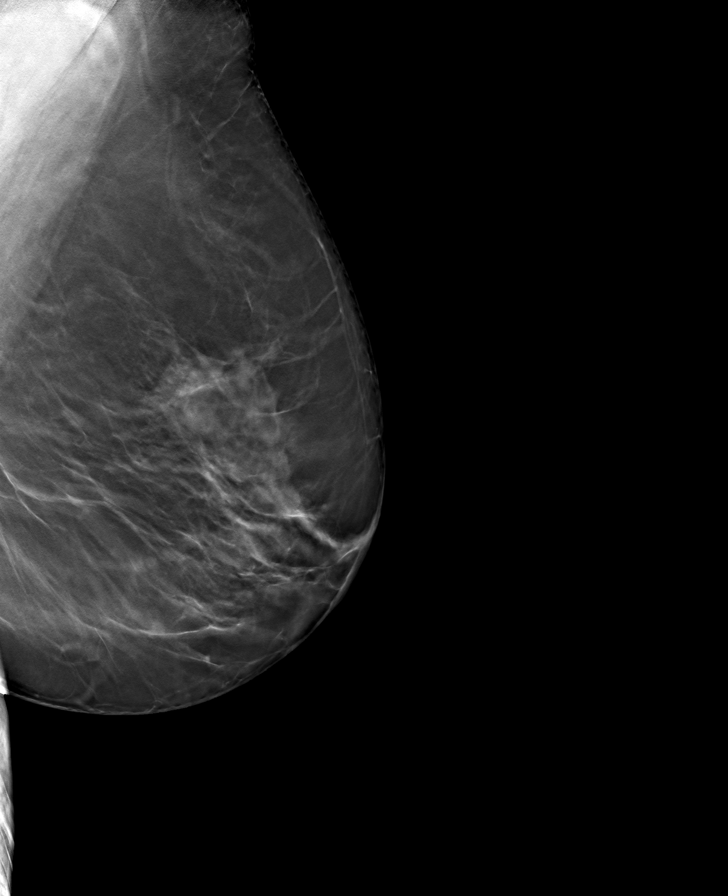

[R MLO tomo · tomo slice 45/89.0]
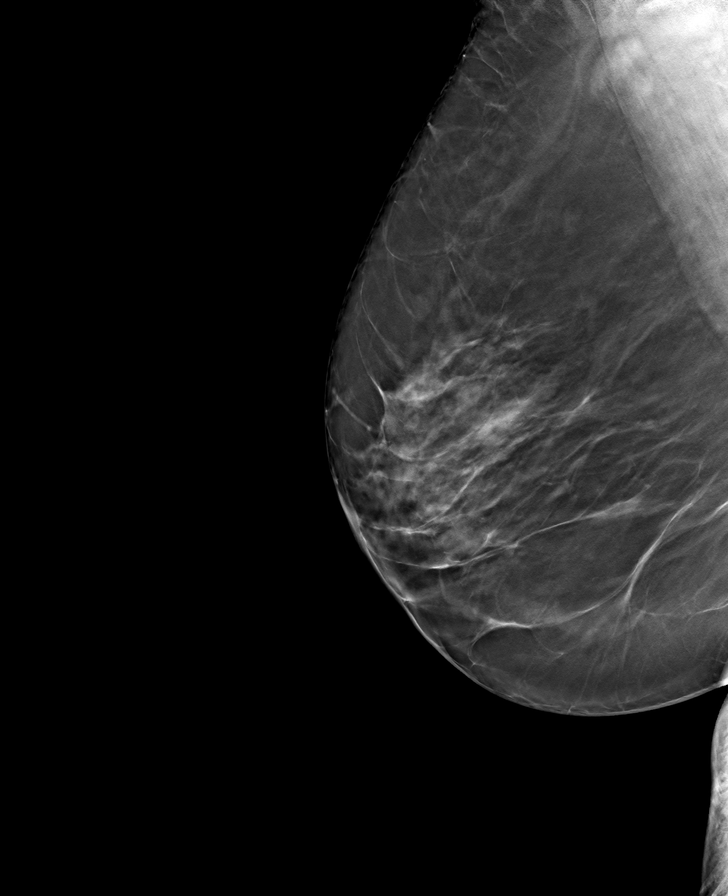

[L CC tomo · tomo slice 47/92.0]
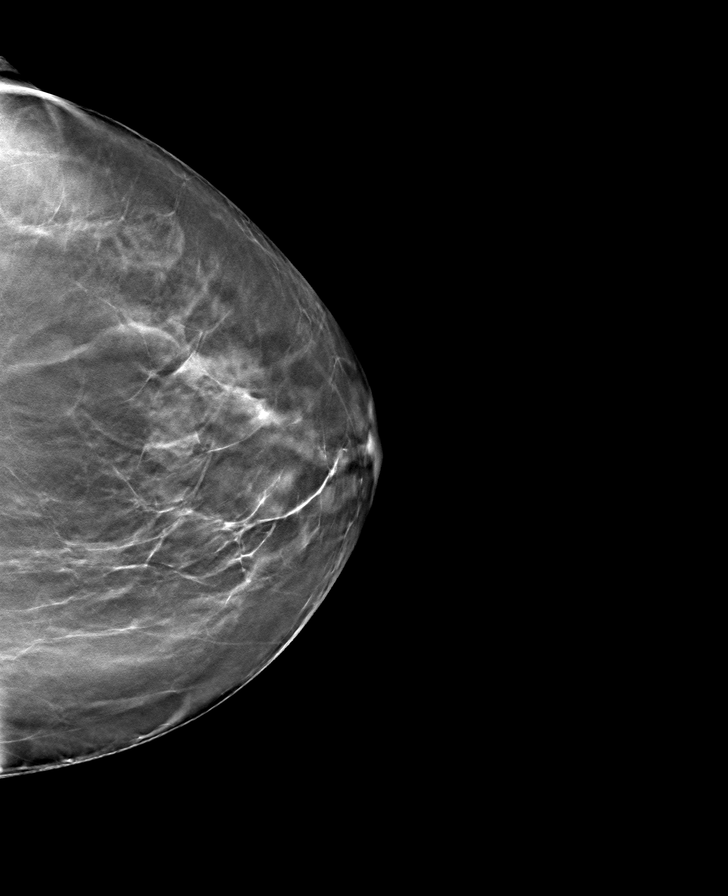

[R CC tomo · tomo slice 45/90.0]
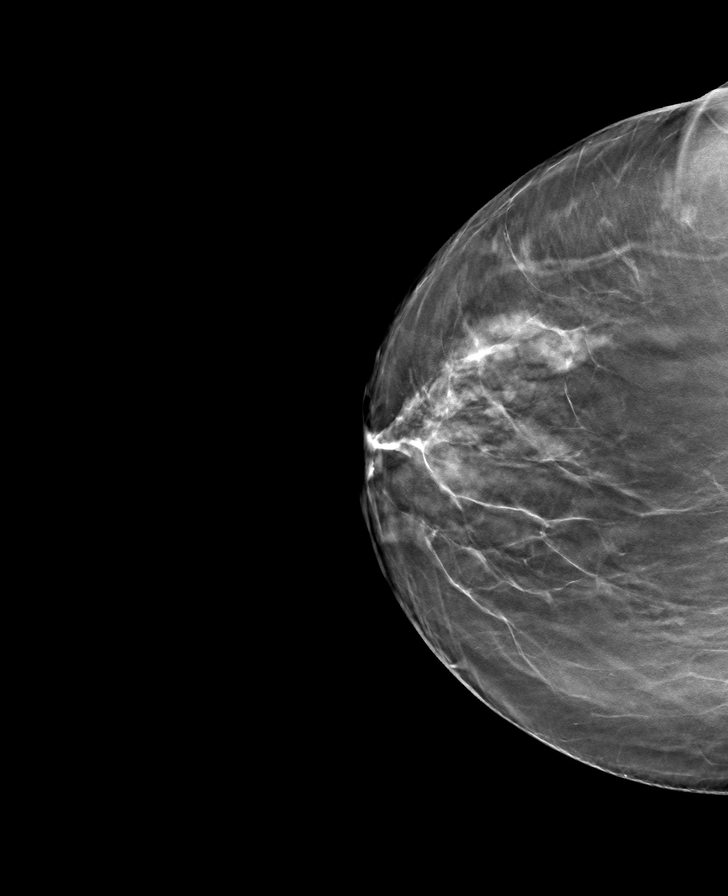

[8 of 24 positions shown; findings below may reference images not displayed]

ACR Breast Density Category b: There are scattered areas of
fibroglandular density.
FINDINGS: There are no findings suspicious for malignancy.
IMPRESSION: No mammographic evidence of malignancy. A result letter of this
screening mammogram will be mailed directly to the patient.

RECOMMENDATION:
Screening mammogram in one year. (Code:51-O-LD2)

BI-RADS CATEGORY  1: Negative.

## 2022-07-01 ENCOUNTER — Other Ambulatory Visit: Payer: Self-pay | Admitting: Physician Assistant

## 2022-07-01 DIAGNOSIS — Z0000 Encounter for general adult medical examination without abnormal findings: Secondary | ICD-10-CM

## 2022-07-18 ENCOUNTER — Other Ambulatory Visit: Payer: Self-pay | Admitting: Podiatry

## 2022-07-19 NOTE — Telephone Encounter (Signed)
Patient needs an appointment before refills can be authorized.

## 2022-08-09 ENCOUNTER — Other Ambulatory Visit: Payer: Self-pay | Admitting: Podiatry

## 2022-10-06 ENCOUNTER — Other Ambulatory Visit: Payer: Self-pay | Admitting: Physician Assistant

## 2022-10-06 ENCOUNTER — Ambulatory Visit
Admission: RE | Admit: 2022-10-06 | Discharge: 2022-10-06 | Disposition: A | Discharge status: Home / Self Care | Payer: BC Managed Care – PPO | Source: Ambulatory Visit | Attending: Physician Assistant | Admitting: Physician Assistant

## 2022-10-06 DIAGNOSIS — M25512 Pain in left shoulder: Secondary | ICD-10-CM

## 2022-10-14 ENCOUNTER — Ambulatory Visit
Admission: RE | Admit: 2022-10-14 | Discharge: 2022-10-14 | Disposition: A | Discharge status: Home / Self Care | Payer: BC Managed Care – PPO | Source: Ambulatory Visit | Attending: Physician Assistant | Admitting: Physician Assistant

## 2022-10-14 DIAGNOSIS — Z0000 Encounter for general adult medical examination without abnormal findings: Secondary | ICD-10-CM

## 2022-11-09 ENCOUNTER — Other Ambulatory Visit: Payer: Self-pay | Admitting: Physician Assistant

## 2022-11-09 DIAGNOSIS — G8929 Other chronic pain: Secondary | ICD-10-CM

## 2022-12-14 ENCOUNTER — Encounter: Payer: Self-pay | Admitting: Physician Assistant

## 2022-12-19 ENCOUNTER — Ambulatory Visit
Admission: RE | Admit: 2022-12-19 | Discharge: 2022-12-19 | Disposition: A | Discharge status: Home / Self Care | Payer: BC Managed Care – PPO | Source: Ambulatory Visit | Attending: Physician Assistant | Admitting: Physician Assistant

## 2022-12-19 DIAGNOSIS — G8929 Other chronic pain: Secondary | ICD-10-CM

## 2022-12-19 MED ORDER — GADOPICLENOL 0.5 MMOL/ML IV SOLN
7.5000 mL | Freq: Once | INTRAVENOUS | Status: AC | PRN
  Administered 2022-12-19: 7.5 mL via INTRAVENOUS

## 2023-05-12 ENCOUNTER — Encounter: Payer: Self-pay | Admitting: Genetic Counselor

## 2023-07-12 ENCOUNTER — Encounter: Payer: Self-pay | Admitting: Genetic Counselor

## 2023-07-12 ENCOUNTER — Inpatient Hospital Stay: Payer: Self-pay

## 2023-07-12 ENCOUNTER — Other Ambulatory Visit: Payer: Self-pay | Admitting: Genetic Counselor

## 2023-07-12 ENCOUNTER — Inpatient Hospital Stay: Payer: Self-pay | Attending: Genetic Counselor | Admitting: Genetic Counselor

## 2023-07-12 DIAGNOSIS — Z8041 Family history of malignant neoplasm of ovary: Secondary | ICD-10-CM

## 2023-07-12 DIAGNOSIS — Z832 Family history of diseases of the blood and blood-forming organs and certain disorders involving the immune mechanism: Secondary | ICD-10-CM | POA: Insufficient documentation

## 2023-07-12 DIAGNOSIS — Z8049 Family history of malignant neoplasm of other genital organs: Secondary | ICD-10-CM | POA: Insufficient documentation

## 2023-07-12 DIAGNOSIS — Z800 Family history of malignant neoplasm of digestive organs: Secondary | ICD-10-CM | POA: Diagnosis not present

## 2023-07-12 DIAGNOSIS — Z8042 Family history of malignant neoplasm of prostate: Secondary | ICD-10-CM | POA: Insufficient documentation

## 2023-07-12 LAB — GENETIC SCREENING ORDER

## 2023-07-12 NOTE — Progress Notes (Signed)
 REFERRING PROVIDER: Karalee Oscar, PA [address] [place],  [place] [address]  PRIMARY PROVIDER:  Karalee Oscar, PA  PRIMARY REASON FOR VISIT:  1. Family history of ovarian cancer   2. Family history of prostate cancer   3. Family history of uterine cancer   4. Family history of colon cancer   5. Family history of aplastic anemia      HISTORY OF PRESENT ILLNESS:   Deborah Hughes, a 53 y.o. female, was seen for a [facility] cancer genetics consultation at the request of Dr. Tomi Foy due to a family history of cancer and aplastic anemia.  Deborah Hughes presents to clinic today to discuss the possibility of a hereditary predisposition to cancer, genetic testing, and to further clarify her future cancer risks, as well as potential cancer risks for family members.   Deborah Hughes is a 53 y.o. female with no personal history of cancer.    CANCER HISTORY:  Oncology History   No history exists.     RISK FACTORS:  Menarche was at age 37.  First live birth at age N/A.  OCP use for approximately 1 years.  Ovaries intact: yes.  Hysterectomy: no.  Menopausal status: postmenopausal.  HRT use: 0 years. Colonoscopy: yes; 2 polyps. Mammogram within the last year: yes. Number of breast biopsies: 1. Up to date with pelvic exams: yes. Any excessive radiation exposure in the past: no  Past Medical History:  Diagnosis Date   Allergic rhinitis    Allergy    Dysmenorrhea    Family history of colon cancer    Family history of ovarian cancer    Family history of prostate cancer    Family history of uterine cancer     Past Surgical History:  Procedure Laterality Date   WISDOM TOOTH EXTRACTION      Social History   Socioeconomic History   Marital status: Single    Spouse name: Not on file   Number of children: Not on file   Years of education: Not on file   Highest education level: Not on file  Occupational History   Not on file  Tobacco Use   Smoking status:  Never    Passive exposure: Yes   Smokeless tobacco: Never   Tobacco comments:    Significant other.   Substance and Sexual Activity   Alcohol use: Yes    Comment: Social.    Drug use: No   Sexual activity: Not on file  Other Topics Concern   Not on file  Social History Narrative   [facility] (08/04/16):   Originally from [facility]. Has always lived in [place]. 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 Has traveled to [place], [place], & [place]. Does have a fish. No bird, mold, or hot tub exposure.    Social Drivers of Corporate investment banker Strain: Not on file  Food Insecurity: Not on file  Transportation Needs: Not on file  Physical Activity: Not on file  Stress: Not on file  Social Connections: Not on file     FAMILY HISTORY:  We obtained a detailed, 4-generation family history.  Significant diagnoses are listed below: Family History  Problem Relation Age of Onset   High Cholesterol Mother    Glaucoma Mother    Rheum arthritis Mother    Aplastic anemia Mother 14   Stroke Father    High Cholesterol Father    Diabetes Father  Aplastic anemia Maternal Aunt        dx. > 50   Diabetes Paternal Aunt    Uterine cancer Maternal Grandmother    Prostate cancer Maternal Grandfather    Ovarian cancer Paternal Grandmother    Colon cancer Paternal Grandfather    Diabetes Other    Rheum arthritis Other    Hypertension Other    Aplastic anemia Cousin        dx. 30s   Lung disease Neg Hx      The patient does not have children.  She has two sisters and a paternal half brother.  Both parents are living.  The patient's mother was diagnosed with aplastic anemia at 62 and receives blood transfusions.  She has five maternal half siblings, one sister has aplastic anemia and her daughter has aplastic anemia.  The maternal grandmother had uterine cancer and the grandfather had prostate cancer.  The patient's father is living.   He had two brothers and two sisters who do not have cancer and two maternal half siblings who did not have cancer.  The paternal grandmother had ovarian cancer and the grandfather had colon cancer.  Deborah Hughes is unaware of previous family history of genetic testing for hereditary cancer risks. There is no reported Ashkenazi Jewish ancestry. There is no known consanguinity.  GENETIC COUNSELING ASSESSMENT: Deborah Hughes is a 53 y.o. female with a family history of cancer and aplastic anemia which is somewhat suggestive of a hereditary cancer syndrome and predisposition to cancer given the family history of ovarian cancer and the family history of aplastic anemia. We, therefore, discussed and recommended the following at today's visit.   DISCUSSION: We discussed that, in general, most cancer is not inherited in families, but instead is sporadic or familial. Sporadic cancers occur by chance and typically happen at older ages (>50 years) as this type of cancer is caused by genetic changes acquired during an individual's lifetime. Some families have more cancers than would be expected by chance; however, the ages or types of cancer are not consistent with a known genetic mutation or known genetic mutations have been ruled out. This type of familial cancer is thought to be due to a combination of multiple genetic, environmental, hormonal, and lifestyle factors. While this combination of factors likely increases the risk of cancer, the exact source of this risk is not currently identifiable or testable.  We discussed that 5 - 10% of cancer is hereditary, with most cases of ovarian cancer associated with BRCA mutations.  There are other genes that can be associated with hereditary ovarian cancer syndromes.  These include BRIP1, RAD51C, RAD51D and Holsopple syndrome. We discussed that Aplastic Anemia falls under the category of Bone Marrow Failure (BMF). According to NCCN guidelines, individuals with a diagnosis of Aplastic  Anemia (AA)should undergo genetic testing for hereditary causes of the condition, but there is not a recommendation for individuals with a family history of AA.  Most AA is sporadic, or idiopathic, and can be caused by immune related conditions, as well as exposures.  However, some cases can be associated with hereditary mutations in genes such as RUNX1, CEBPA, GATA2 and DDX41.  We discussed that testing is beneficial for several reasons including knowing how to follow individuals after completing their treatment, identifying whether potential treatment options such as PARP inhibitors would be beneficial, and understand if other family members could be at risk for cancer and allow them to undergo genetic testing.   Deborah Hughes was informed  of the benefits and limitations of each panel, including that expanded pan-cancer panels contain genes that do not have clear management guidelines at this point in time.  We also discussed that as the number of genes included on a panel increases, the chances of variants of uncertain significance increases. Deborah Hughes decided to pursue genetic testing for the Multi Cancer + leukemia/lymphoma/BMF gene panel.   Based on Deborah Hughes's family history of cancer, she meets medical criteria for genetic testing. Though Deborah Hughes is not personally affected, there are no affected family members that are willing/able/available to undergo hereditary cancer testing.  Therefore, Deborah Hughes the most informative family member available.  Despite that she meets criteria, she may still have an out of pocket cost. We discussed that if her out of pocket cost for testing is over $100, the laboratory will call and confirm whether she wants to proceed with testing.  If the out of pocket cost of testing is less than $100 she will be billed by the genetic testing laboratory.   We discussed that some people do not want to undergo genetic testing due to fear of genetic discrimination.  The Genetic  Information Nondiscrimination Act (GINA) was signed into federal law in 2008. GINA prohibits health insurers and most employers from discriminating against individuals based on genetic information (including the results of genetic tests and family history information). According to GINA, health insurance companies cannot consider genetic information to be a preexisting condition, nor can they use it to make decisions regarding coverage or rates. GINA also makes it illegal for most employers to use genetic information in making decisions about hiring, firing, promotion, or terms of employment. It is important to note that GINA does not offer protections for life insurance, disability insurance, or long-term care insurance. GINA does not apply to those in the [employer], those who work for companies with less than 15 employees, and new life insurance or long-term disability insurance policies.  Health status due to a cancer diagnosis is not protected under GINA. More information about GINA can be found by visiting [email or web address].  PLAN: After considering the risks, benefits, and limitations, Deborah Hughes provided informed consent to pursue genetic testing and the blood sample was sent to [facility] for analysis of the Multi cancer + Leukemia/lymphoma/BMF panel. Results should be available within approximately 2-3 weeks' time, at which point they will be disclosed by telephone to Deborah Hughes, as will any additional recommendations warranted by these results. Deborah Hughes will receive a summary of her genetic counseling visit and a copy of her results once available. This information will also be available in Epic.   Lastly, we encouraged Deborah Hughes to remain in contact with cancer genetics annually so that we can continuously update the family history and inform her of any changes in cancer genetics and testing that may be of benefit for this family.   Deborah Hughes questions were answered to her satisfaction  today. Our contact information was provided should additional questions or concerns arise. Thank you for the referral and allowing us  to share in the care of your patient.   Koleen Celia P. [place], MS, CGC Licensed, Patent attorney [email or web address] phone: [phone]  77 minutes were spent on the date of the encounter in service to the patient including preparation, face-to-face consultation, documentation and care coordination.  The patient was seen alone.  Drs. Johnna Nakai, and/or Gudena were available for questions, if needed..    _______________________________________________________________________ For Office Staff:  Number of people  involved in session: 1 Was an Intern/ student involved with case: no

## 2023-08-04 ENCOUNTER — Telehealth: Payer: Self-pay | Admitting: Genetic Counselor

## 2023-08-04 NOTE — Telephone Encounter (Signed)
 LM on VM that I was following up regarding her genetic testing and asked her to please call.  Left CB instructions.

## 2023-08-22 ENCOUNTER — Telehealth: Payer: Self-pay | Admitting: Genetic Counselor

## 2023-08-22 ENCOUNTER — Encounter: Payer: Self-pay | Admitting: Genetic Counselor

## 2023-08-22 DIAGNOSIS — Z1379 Encounter for other screening for genetic and chromosomal anomalies: Secondary | ICD-10-CM | POA: Insufficient documentation

## 2023-08-22 NOTE — Telephone Encounter (Signed)
 LM on VM that results are back and to please call.  Left CB instructions.

## 2023-08-23 ENCOUNTER — Ambulatory Visit: Payer: Self-pay | Admitting: Genetic Counselor

## 2023-08-23 DIAGNOSIS — Z1379 Encounter for other screening for genetic and chromosomal anomalies: Secondary | ICD-10-CM

## 2023-08-23 NOTE — Progress Notes (Signed)
 HPI:  Ms. Nickle was previously seen in the [facility] due to a family history of aplastic anemia and concerns regarding a hereditary predisposition to aplastic anemia as well as cancer. Please refer to our prior cancer genetics clinic note for more information regarding our discussion, assessment and recommendations, at the time. Ms. Guidry recent genetic test results were disclosed to her, as were recommendations warranted by these results. These results and recommendations are discussed in more detail below.  CANCER HISTORY:  Oncology History   No history exists.    FAMILY HISTORY:  We obtained a detailed, 4-generation family history.  Significant diagnoses are listed below: Family History  Problem Relation Age of Onset   High Cholesterol Mother    Glaucoma Mother    Rheum arthritis Mother    Aplastic anemia Mother 69   Stroke Father    High Cholesterol Father    Diabetes Father    Aplastic anemia Maternal Aunt        dx. > 50   Diabetes Paternal Aunt    Uterine cancer Maternal Grandmother    Prostate cancer Maternal Grandfather    Ovarian cancer Paternal Grandmother    Colon cancer Paternal Grandfather    Diabetes Other    Rheum arthritis Other    Hypertension Other    Aplastic anemia Cousin        dx. 30s   Lung disease Neg Hx        The patient does not have children.  She has two sisters and a paternal half brother.  Both parents are living.   The patient's mother was diagnosed with aplastic anemia at 22 and receives blood transfusions.  She has five maternal half siblings, one sister has aplastic anemia and her daughter has aplastic anemia.  The maternal grandmother had uterine cancer and the grandfather had prostate cancer.   The patient's father is living.  He had two brothers and two sisters who do not have cancer and two maternal half siblings who did not have cancer.  The paternal grandmother had ovarian cancer and the grandfather had colon  cancer.   Ms. Comunale is unaware of previous family history of genetic testing for hereditary cancer risks. There is no reported Ashkenazi Jewish ancestry. There is no known consanguinity  GENETIC TEST RESULTS: Genetic testing reported out on August 19, 2023 through the Custom cancer panel that included the Multi-cancer panel+RNA and the leukemia and lymphoma panel found no pathogenic mutations. The Multi-Cancer + leukemia/lymphoma Panel offered by [facility] includes sequencing and/or deletion/duplication analysis of the following 185 genes:  ABCB7, ACD, ADA, ADA2, AIP, AK2*, ALAS2, ALK, ANKRD26*, AP3B1, APC*, ATM*, AXIN2, BAP1, BARD1, BLM, BMPR1A, BRCA1, BRCA2, BRIP1, CARD11, CARMIL2, CASP8, CBL, CD27, CD40, CD40LG, CDC73, CDH1, CDK4, CDKN1B, CDKN2A (p14ARF), CDKN2A (p16INK4a), CEBPA, CEBPE, CHEK2, CLPB, CSF3R, CTC1, CTLA4, CTNNA1, CTPS1, CXCR4, DDX41, DICER1*, DKC1, DNAJC21, DOCK8, EFL1*, EGFR, ELANE, EPCAM*, ERCC4, ERCC6L2, ETV6, FADD, FANCA, FANCB, FANCC, FANCD2*, FANCE, FANCF, FANCG, FANCI, FANCL*, FAS, FASLG, FCHO1, FH*, FLCN, G6PC, G6PC3, GATA1, GATA2, GFI1*, GREM1*, HAX1, HOXB13, HTRA2, IKZF1, IL2RA, IL2RB, ITK, JAGN1, KIT, KRAS, LYST, LZTR1, MAGT1, MAX*, MBD4, MCM4, MECOM, MEN1*, MET*, MITF, MLH1*, MPL, MSH2*, MSH3*, MSH6*, MUTYH, MYSM1, NBN, NF1*, NF2, NHP2, NOP10, NTHL1, PALB2, PARN, PDGFRA, PIK3CD, PIK3R1, PMS2*, POLD1*, POLE, POT1, PRKAR1A, PRKCD, PTCH1, PTEN*, PTPN11, RAB27A, RAC2, RAD51C, RAD51D, RASGRP1, RB1*, RBM8A, RECQL4*, RET, RHOH, RMRP, RPL11, RPL15, RPL26, RPL35A, RPL5, RPS10, RPS19, RPS24, RPS26, RPS29, RPS7, RTEL1, RUNX1, SAMD9, SAMD9L, SDHA*, SDHAF2, SDHB, SDHC*,  SDHD, SH2D1A, SLC37A4, SLX4, SMAD4, SMARCA4, SMARCB1, SMARCD2, SMARCE1, SRP54, SRP72, STAT3, STK11, STK4, STN1, STXBP2, SUFU, TAZ, TCN2, TERC, TERT, TIMM50, TINF2, TMEM127, TNFRSF13B, TP53, TPP2, TSC1*, TSC2, UBE2T, USB1, VHL, VPS13B, VPS45, WAS, WRAP53, and XIAP. The test report has been scanned into EPIC and is located under the  Molecular Pathology section of the Results Review tab.  A portion of the result report is included below for reference.     We discussed with Ms. Boughner that because current genetic testing is not perfect, it is possible there may be a gene mutation in one of these genes that current testing cannot detect, but that chance is small.  We also discussed, that there could be another gene that has not yet been discovered, or that we have not yet tested, that is responsible for the cancer diagnoses in the family. It is also possible there is a hereditary cause for the cancer in the family that Ms. Alia did not inherit and therefore was not identified in her testing.  Therefore, it is important to remain in touch with cancer genetics in the future so that we can continue to offer Ms. Silveri the most up to date genetic testing.   Genetic testing did identify a variant of uncertain significance (VUS) was identified in the JAGN1 gene called c.35C>T (p.Thr12Ile).  At this time, it is unknown if this variant is associated with increased hematological risk or if this is a normal finding, but most variants such as this get reclassified to being inconsequential. It should not be used to make medical management decisions. With time, we suspect the lab will determine the significance of this variant, if any. If we do learn more about it, we will try to contact Ms. Farone to discuss it further. However, it is important to stay in touch with us  periodically and keep the address and phone number up to date.  ADDITIONAL GENETIC TESTING: We discussed with Ms. Ek that her genetic testing was fairly extensive.  If there are genes identified to increase cancer risk that can be analyzed in the future, we would be happy to discuss and coordinate this testing at that time.    CANCER SCREENING RECOMMENDATIONS: Ms. Bejarano test result is considered negative (normal).  This means that we have not identified a hereditary cause for her  family history of aplastic anemia and cancer at this time. Most cancers happen by chance and this negative test suggests that her family history of aplastic anemia and cancer may fall into this category.    Possible reasons for Ms. Alabi's negative genetic test include:  1. There may be a gene mutation in one of these genes that current testing methods cannot detect but that chance is small.  2. There could be another gene that has not yet been discovered, or that we have not yet tested, that is responsible for the cancer diagnoses in the family.  3.  There may be no hereditary risk for cancer in the family. The cancers in Ms. Cisney and/or her family may be sporadic/familial or due to other genetic and environmental factors. 4. It is also possible there is a hereditary cause for the cancer in the family that Ms. Ambrosini did not inherit.  Therefore, it is recommended she continue to follow the cancer management and screening guidelines provided by her hematologist and primary healthcare provider. An individual's cancer risk and medical management are not determined by genetic test results alone. Overall cancer risk assessment incorporates additional factors, including  personal medical history, family history, and any available genetic information that may result in a personalized plan for cancer prevention and surveillance  RECOMMENDATIONS FOR FAMILY MEMBERS:   Since she did not inherit a identifiable mutation in a cancer predisposition gene included on this panel, her children could not have inherited a known mutation from her in one of these genes. Individuals in this family might be at some increased risk of developing cancer, over the general population risk, simply due to the family history of cancer.  We recommended women in this family have a yearly mammogram beginning at age 80, or 25 years younger than the earliest onset of cancer, an annual clinical breast exam, and perform monthly breast  self-exams. Women in this family should also have a gynecological exam as recommended by their primary provider. All family members should be referred for colonoscopy starting at age 34, or 54 years younger than the earliest onset of cancer.  FOLLOW-UP: Lastly, we discussed with Ms. Shorten that cancer genetics is a rapidly advancing field and it is possible that new genetic tests will be appropriate for her and/or her family members in the future. We encouraged her to remain in contact with cancer genetics on an annual basis so we can update her personal and family histories and let her know of advances in cancer genetics that may benefit this family.   Our contact number was provided. Ms. Lenoir questions were answered to her satisfaction, and she knows she is welcome to call us  at anytime with additional questions or concerns.   Darice Monte, MS, [facility] Licensed, Certified Genetic Counselor [email or web address]

## 2023-08-23 NOTE — Telephone Encounter (Signed)
 Revealed negative genetic testing.  Discussed that we do not know why there is cancer or aplastic anemia in the family. It could be due to a different gene that we are not testing, or maybe our current technology may not be able to pick something up.  It will be important for her to keep in contact with genetics to keep up with whether additional testing may be needed.   One VUS in JAGN1 was identified.  This will not change medical management.

## 2023-09-14 ENCOUNTER — Other Ambulatory Visit: Payer: Self-pay | Admitting: Physician Assistant

## 2023-09-14 DIAGNOSIS — Z1231 Encounter for screening mammogram for malignant neoplasm of breast: Secondary | ICD-10-CM

## 2023-10-20 ENCOUNTER — Ambulatory Visit

## 2023-10-26 ENCOUNTER — Ambulatory Visit
Admission: RE | Admit: 2023-10-26 | Discharge: 2023-10-26 | Disposition: A | Discharge status: Home / Self Care | Source: Ambulatory Visit | Attending: Physician Assistant | Admitting: Physician Assistant

## 2023-10-26 DIAGNOSIS — Z1231 Encounter for screening mammogram for malignant neoplasm of breast: Secondary | ICD-10-CM

## 2024-03-10 ENCOUNTER — Encounter (HOSPITAL_COMMUNITY): Payer: Self-pay

## 2024-03-10 ENCOUNTER — Ambulatory Visit (HOSPITAL_COMMUNITY)
Admission: EM | Admit: 2024-03-10 | Discharge: 2024-03-10 | Disposition: A | Discharge status: Home / Self Care | Attending: Family Medicine | Admitting: Family Medicine

## 2024-03-10 DIAGNOSIS — J069 Acute upper respiratory infection, unspecified: Secondary | ICD-10-CM | POA: Diagnosis not present

## 2024-03-10 DIAGNOSIS — R350 Frequency of micturition: Secondary | ICD-10-CM | POA: Diagnosis not present

## 2024-03-10 LAB — POCT URINE DIPSTICK
Bilirubin, UA: NEGATIVE
Glucose, UA: NEGATIVE mg/dL
Ketones, POC UA: NEGATIVE mg/dL
Leukocytes, UA: NEGATIVE
Nitrite, UA: NEGATIVE
POC PROTEIN,UA: NEGATIVE
Spec Grav, UA: 1.015
Urobilinogen, UA: 0.2 U/dL
pH, UA: 8.5 — AB

## 2024-03-10 LAB — POC COVID19/FLU A&B COMBO
Covid Antigen, POC: NEGATIVE
Influenza A Antigen, POC: NEGATIVE
Influenza B Antigen, POC: NEGATIVE

## 2024-03-10 MED ORDER — BENZONATATE 100 MG PO CAPS: 100.0000 mg | ORAL_CAPSULE | Freq: Three times a day (TID) | ORAL | 0 refills | Status: AC

## 2024-03-10 MED ORDER — IBUPROFEN 800 MG PO TABS: 800.0000 mg | ORAL_TABLET | Freq: Three times a day (TID) | ORAL | 0 refills | Status: AC | PRN

## 2024-03-10 MED ORDER — PROMETHAZINE-DM 6.25-15 MG/5ML PO SYRP: 5.0000 mL | ORAL_SOLUTION | Freq: Four times a day (QID) | ORAL | 0 refills | Status: DC | PRN

## 2024-03-10 NOTE — Discharge Instructions (Addendum)
 Testing for flu and COVID is negative.  This is most likely some other virus causing your symptoms.  The urinalysis does not show signs of any infection.  There also is no sugar/glucose in it.  Take benzonatate  100 mg, 1 tab every 8 hours as needed for cough.   Take ibuprofen  800 mg--1 tab every 8 hours as needed for pain.

## 2024-03-10 NOTE — ED Provider Notes (Addendum)
 " [facility]    CSN: [id number] Arrival date & time: 03/10/24  0940      History   Chief Complaint Chief Complaint  Patient presents with   Cough    HPI Deborah Hughes is a 54 y.o. female.    Cough   Here for 2-day history of nasal congestion and rhinorrhea and postnasal discharge and cough.  She has not had any fever.  No nausea vomiting or diarrhea.  She does have some exercise-induced asthma, but it has not been bothering her more so since the congestion symptoms began. Uncertain exposures, but she did visit a doctor's office recently for blood work  She is allergic to shellfish  She does not have periods anymore    Past Medical History:  Diagnosis Date   Allergic rhinitis    Allergy    Dysmenorrhea    Family history of colon cancer    Family history of ovarian cancer    Family history of prostate cancer    Family history of uterine cancer     Patient Active Problem List   Diagnosis Date Noted   Genetic testing 08/22/2023   Family history of aplastic anemia 07/12/2023   Family history of ovarian cancer    Family history of prostate cancer    Family history of uterine cancer    Family history of colon cancer    Chronic fatigue syndrome 08/19/2021   Dysmenorrhea 08/19/2021   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      Home Medications    Prior to Admission medications  Medication Sig Start Date End Date Taking? Authorizing Provider  ibuprofen  (ADVIL ) 800 MG tablet Take 1 tablet (800 mg total) by mouth every 8 (eight)  hours as needed (pain). 03/10/24  Yes Vonna Sharlet POUR, MD  promethazine -dextromethorphan (PROMETHAZINE -DM) 6.25-15 MG/5ML syrup Take 5 mLs by mouth 4 (four) times daily as needed for cough. 03/10/24  Yes Vonna Sharlet POUR, MD  albuterol  (VENTOLIN  HFA) 108 (90 Base) MCG/ACT inhaler 2 puffs as needed prior to exercise    [provider]  Ascorbic Acid (VITAMIN C) 500 MG CAPS See admin instructions.    [provider]  fluticasone  (FLONASE ) 50 MCG/ACT nasal spray Place 2 sprays into both nostrils daily. Patient not taking: Reported on 02/11/2020 09/18/16   Wiseman, Brittany D, PA-C  magnesium gluconate (MAGONATE) 500 MG tablet Take 500 mg by mouth 3 (three) times daily.    [provider]  metroNIDAZOLE (FLAGYL) 500 MG tablet 1 tablet 03/21/20   [provider]  Misc Natural Products (NEURIVA) CAPS See admin instructions.    [provider]  pseudoephedrine  (SUDAFED 12 HOUR) 120 MG 12 hr tablet Take 1 tablet (120 mg total) by mouth 2 (two) times daily. 09/18/16   Wiseman, Brittany D, PA-C  Red Yeast Rice Extract (RED YEAST RICE PO) Take 1 tablet by mouth daily.    [provider]  vitamin B-12 (CYANOCOBALAMIN) 1000 MCG tablet 1 tablet    [provider]  Vitamin D, Ergocalciferol, (DRISDOL) 1.25 MG (50000 UNIT)  CAPS capsule Take 50,000 Units by mouth once a week. 05/05/21   [provider]    Family History Family History  Problem Relation Age of Onset   High Cholesterol Mother    Glaucoma Mother    Rheum arthritis Mother    Aplastic anemia Mother 76   Stroke Father    High Cholesterol Father    Diabetes Father    Aplastic anemia Maternal Aunt        dx. > 50   Diabetes Paternal Aunt    Uterine cancer Maternal Grandmother    Prostate cancer Maternal Grandfather    Ovarian cancer Paternal Grandmother    Colon cancer Paternal Grandfather    Aplastic anemia Cousin        dx. 30s   Diabetes Other    Rheum arthritis Other     Hypertension Other    Lung disease Neg Hx    Breast cancer Neg Hx     Social History Social History[1]   Allergies   Shellfish allergy   Review of Systems Review of Systems  Respiratory:  Positive for cough.      Physical Exam Triage Vital Signs ED Triage Vitals  Encounter Vitals Group     BP 03/10/24 1136 109/77     Girls Systolic BP Percentile --      Girls Diastolic BP Percentile --      Boys Systolic BP Percentile --      Boys Diastolic BP Percentile --      Pulse Rate 03/10/24 1136 77     Resp 03/10/24 1136 17     Temp 03/10/24 1136 97.7 F (36.5 C)     Temp Source 03/10/24 1136 Oral     SpO2 03/10/24 1136 98 %     Weight --      Height --      Head Circumference --      Peak Flow --      Pain Score 03/10/24 1135 3     Pain Loc --      Pain Education --      Exclude from Growth Chart --    No data found.  Updated Vital Signs BP 109/77 (BP Location: Left Arm)   Pulse 77   Temp 97.7 F (36.5 C) (Oral)   Resp 17   SpO2 98%   Visual Acuity Right Eye Distance:   Left Eye Distance:   Bilateral Distance:    Right Eye Near:   Left Eye Near:    Bilateral Near:     Physical Exam Vitals reviewed.  Constitutional:      General: She is not in acute distress.    Appearance: She is not ill-appearing, toxic-appearing or diaphoretic.  HENT:     Right Ear: Tympanic membrane and ear canal normal.     Left Ear: Tympanic membrane and ear canal normal.     Nose: Congestion present.     Mouth/Throat:     Mouth: Mucous membranes are moist.     Comments: There is mild erythema of the tonsillar pillars. No tonsillar hypertrophy Eyes:     Extraocular Movements: Extraocular movements intact.     Conjunctiva/sclera: Conjunctivae normal.     Pupils: Pupils are equal, round, and reactive to light.  Cardiovascular:     Rate and Rhythm: Normal rate and regular rhythm.     Heart sounds: No murmur heard. Pulmonary:     Effort: Pulmonary effort is normal. No  respiratory distress.  Breath sounds: No wheezing, rhonchi or rales.  Chest:     Chest wall: No tenderness.  Musculoskeletal:     Cervical back: Neck supple.  Lymphadenopathy:     Cervical: No cervical adenopathy.  Skin:    Capillary Refill: Capillary refill takes less than 2 seconds.     Coloration: Skin is not jaundiced or pale.  Neurological:     General: No focal deficit present.     Mental Status: She is alert and oriented to person, place, and time.  Psychiatric:        Behavior: Behavior normal.      UC Treatments / Results  Labs (all labs ordered are listed, but only abnormal results are displayed) Labs Reviewed  POCT URINE DIPSTICK - Abnormal; Notable for the following components:      Result Value   Color, UA light yellow (*)    Blood, UA trace-lysed (*)    pH, UA 8.5 (*)    All other components within normal limits  POC COVID19/FLU A&B COMBO    EKG   Radiology No results found.  Procedures Procedures (including critical care time)  Medications Ordered in UC Medications - No data to display  Initial Impression / Assessment and Plan / UC Course  I have reviewed the triage vital signs and the nursing notes.  Pertinent labs & imaging results that were available during my care of the patient were reviewed by me and considered in my medical decision making (see chart for details).     Flu and COVID testing is negative.  Urinalysis is normal without any glucose or leukocytes or red blood cells or nitrites.  There is a trace of lysed blood cells and pH is 8.5  I think her urinary symptoms are most likely a side effect from her medications or possibly a factor drinking more water to help alleviate her current respiratory symptoms. Promethazine  with dextromethorphan is sent in for the cough.  Ibuprofen  800 mg is sent in for her pain  At discharge patient stated to staff that she would prefer a pill for cough instead of this today.  Tessalon  Perles were  therefore sent instead. Final Clinical Impressions(s) / UC Diagnoses   Final diagnoses:  Viral URI  Urinary frequency     Discharge Instructions      Testing for flu and COVID is negative.  This is most likely some other virus causing your symptoms.  The urinalysis does not show signs of any infection.  There also is no sugar/glucose in it.  Take Phenergan  with dextromethorphan syrup--5 mL or 1 teaspoon every 6 hours as needed for cough  Take ibuprofen  800 mg--1 tab every 8 hours as needed for pain.      ED Prescriptions     Medication Sig Dispense Auth. Provider   promethazine -dextromethorphan (PROMETHAZINE -DM) 6.25-15 MG/5ML syrup Take 5 mLs by mouth 4 (four) times daily as needed for cough. 118 mL Vonna Sharlet POUR, MD   ibuprofen  (ADVIL ) 800 MG tablet Take 1 tablet (800 mg total) by mouth every 8 (eight) hours as needed (pain). 21 tablet Cherylann Hobday K, MD      PDMP not reviewed this encounter.    Vonna Sharlet POUR, MD 03/10/24 1228     [1]  Social History Tobacco Use   Smoking status: Never    Passive exposure: Yes   Smokeless tobacco: Never   Tobacco comments:    Significant other.   Substance Use Topics   Alcohol use: Yes  Comment: Social.    Drug use: No     Vonna Sharlet POUR, MD 03/10/24 1232  "

## 2024-03-10 NOTE — ED Triage Notes (Signed)
 Nasal congestion, sinus pain, headache, sneezing, and cough onset yesterday. Patient also having urinary frequency starting today.   Patient tried Claritin, Day and Nyquil with slight relief. No meds taken today.
